# Patient Record
Sex: Female | Born: 1965 | Race: White | Hispanic: No | Marital: Married | Smoking: Never smoker
Health system: Southern US, Community
[De-identification: ages and names within clinical notes are randomized; demographics above are authoritative.]

## PROBLEM LIST (undated history)

## (undated) DIAGNOSIS — I10 Essential (primary) hypertension: Secondary | ICD-10-CM

## (undated) DIAGNOSIS — E785 Hyperlipidemia, unspecified: Secondary | ICD-10-CM

## (undated) HISTORY — DX: Hyperlipidemia, unspecified: E78.5

## (undated) HISTORY — DX: Essential (primary) hypertension: I10

## (undated) HISTORY — PX: ENDOVENOUS ABLATION SAPHENOUS VEIN W/ LASER: SUR449

---

## 1997-05-28 ENCOUNTER — Inpatient Hospital Stay (HOSPITAL_COMMUNITY): Admission: AD | Admit: 1997-05-28 | Discharge: 1997-05-30 | Payer: Self-pay | Admitting: Obstetrics & Gynecology

## 1997-06-01 ENCOUNTER — Encounter: Admission: RE | Admit: 1997-06-01 | Discharge: 1997-08-30 | Payer: Self-pay | Admitting: Obstetrics & Gynecology

## 1999-01-16 ENCOUNTER — Other Ambulatory Visit: Admission: RE | Admit: 1999-01-16 | Discharge: 1999-01-16 | Payer: Self-pay | Admitting: Gynecology

## 2000-02-24 ENCOUNTER — Other Ambulatory Visit: Admission: RE | Admit: 2000-02-24 | Discharge: 2000-02-24 | Payer: Self-pay | Admitting: Gynecology

## 2001-04-07 ENCOUNTER — Other Ambulatory Visit: Admission: RE | Admit: 2001-04-07 | Discharge: 2001-04-07 | Payer: Self-pay | Admitting: Gynecology

## 2002-07-25 ENCOUNTER — Other Ambulatory Visit: Admission: RE | Admit: 2002-07-25 | Discharge: 2002-07-25 | Payer: Self-pay | Admitting: Gynecology

## 2004-01-29 ENCOUNTER — Other Ambulatory Visit: Admission: RE | Admit: 2004-01-29 | Discharge: 2004-01-29 | Payer: Self-pay | Admitting: Gynecology

## 2005-03-23 ENCOUNTER — Other Ambulatory Visit: Admission: RE | Admit: 2005-03-23 | Discharge: 2005-03-23 | Payer: Self-pay | Admitting: Gynecology

## 2008-11-27 ENCOUNTER — Emergency Department (HOSPITAL_BASED_OUTPATIENT_CLINIC_OR_DEPARTMENT_OTHER): Admission: EM | Admit: 2008-11-27 | Discharge: 2008-11-28 | Payer: Self-pay | Admitting: Emergency Medicine

## 2010-07-27 LAB — URINALYSIS, ROUTINE W REFLEX MICROSCOPIC
Bilirubin Urine: NEGATIVE
Ketones, ur: 15 mg/dL — AB
Protein, ur: NEGATIVE mg/dL
Urobilinogen, UA: 1 mg/dL (ref 0.0–1.0)

## 2010-07-27 LAB — URINE CULTURE

## 2015-04-23 DIAGNOSIS — E785 Hyperlipidemia, unspecified: Secondary | ICD-10-CM | POA: Insufficient documentation

## 2015-04-23 DIAGNOSIS — M545 Low back pain, unspecified: Secondary | ICD-10-CM | POA: Insufficient documentation

## 2015-04-23 DIAGNOSIS — R5383 Other fatigue: Secondary | ICD-10-CM | POA: Insufficient documentation

## 2015-04-23 DIAGNOSIS — E559 Vitamin D deficiency, unspecified: Secondary | ICD-10-CM | POA: Insufficient documentation

## 2015-09-11 ENCOUNTER — Encounter: Payer: Self-pay | Admitting: *Deleted

## 2015-09-18 ENCOUNTER — Ambulatory Visit (INDEPENDENT_AMBULATORY_CARE_PROVIDER_SITE_OTHER): Payer: BLUE CROSS/BLUE SHIELD | Admitting: *Deleted

## 2015-09-18 DIAGNOSIS — I781 Nevus, non-neoplastic: Secondary | ICD-10-CM

## 2015-09-18 NOTE — Progress Notes (Signed)
X=.3% Sotradecol administered with a 27g butterfly.  Patient received a total of 6cc.  Treated all major areas of concern for this nice lady. Easy access. Tol well. Anticipate good results. Follow prn.   Compression stockings applied: Yes.

## 2015-09-24 ENCOUNTER — Encounter: Payer: Self-pay | Admitting: Vascular Surgery

## 2015-09-24 ENCOUNTER — Ambulatory Visit: Payer: BLUE CROSS/BLUE SHIELD | Admitting: *Deleted

## 2015-09-24 DIAGNOSIS — I781 Nevus, non-neoplastic: Secondary | ICD-10-CM

## 2015-09-24 NOTE — Progress Notes (Signed)
Pt had an area of concern that she wanted me to look at. I told her that it looks just as it should post tx with sclerotherapy. Pt reassurred. Follow prn.

## 2016-08-20 DIAGNOSIS — I8390 Asymptomatic varicose veins of unspecified lower extremity: Secondary | ICD-10-CM | POA: Insufficient documentation

## 2017-02-23 ENCOUNTER — Other Ambulatory Visit: Payer: Self-pay | Admitting: Obstetrics & Gynecology

## 2017-02-23 DIAGNOSIS — R928 Other abnormal and inconclusive findings on diagnostic imaging of breast: Secondary | ICD-10-CM

## 2017-02-26 ENCOUNTER — Ambulatory Visit
Admission: RE | Admit: 2017-02-26 | Discharge: 2017-02-26 | Disposition: A | Discharge status: Home / Self Care | Payer: BLUE CROSS/BLUE SHIELD | Source: Ambulatory Visit | Attending: Obstetrics & Gynecology | Admitting: Obstetrics & Gynecology

## 2017-02-26 DIAGNOSIS — R928 Other abnormal and inconclusive findings on diagnostic imaging of breast: Secondary | ICD-10-CM

## 2018-05-17 DIAGNOSIS — R638 Other symptoms and signs concerning food and fluid intake: Secondary | ICD-10-CM | POA: Insufficient documentation

## 2018-07-24 IMAGING — MG DIGITAL DIAGNOSTIC UNILATERAL RIGHT MAMMOGRAM
3 series · 3 of 3 positions shown · non-contrast
Comparison: Previous exams including recent screening mammogram
dated 02/19/2017.

CLINICAL DATA: Patient returns today to evaluate right breast
calcifications identified on recent screening mammogram.

EXAM:
DIGITAL DIAGNOSTIC RIGHT MAMMOGRAM

[R CC]
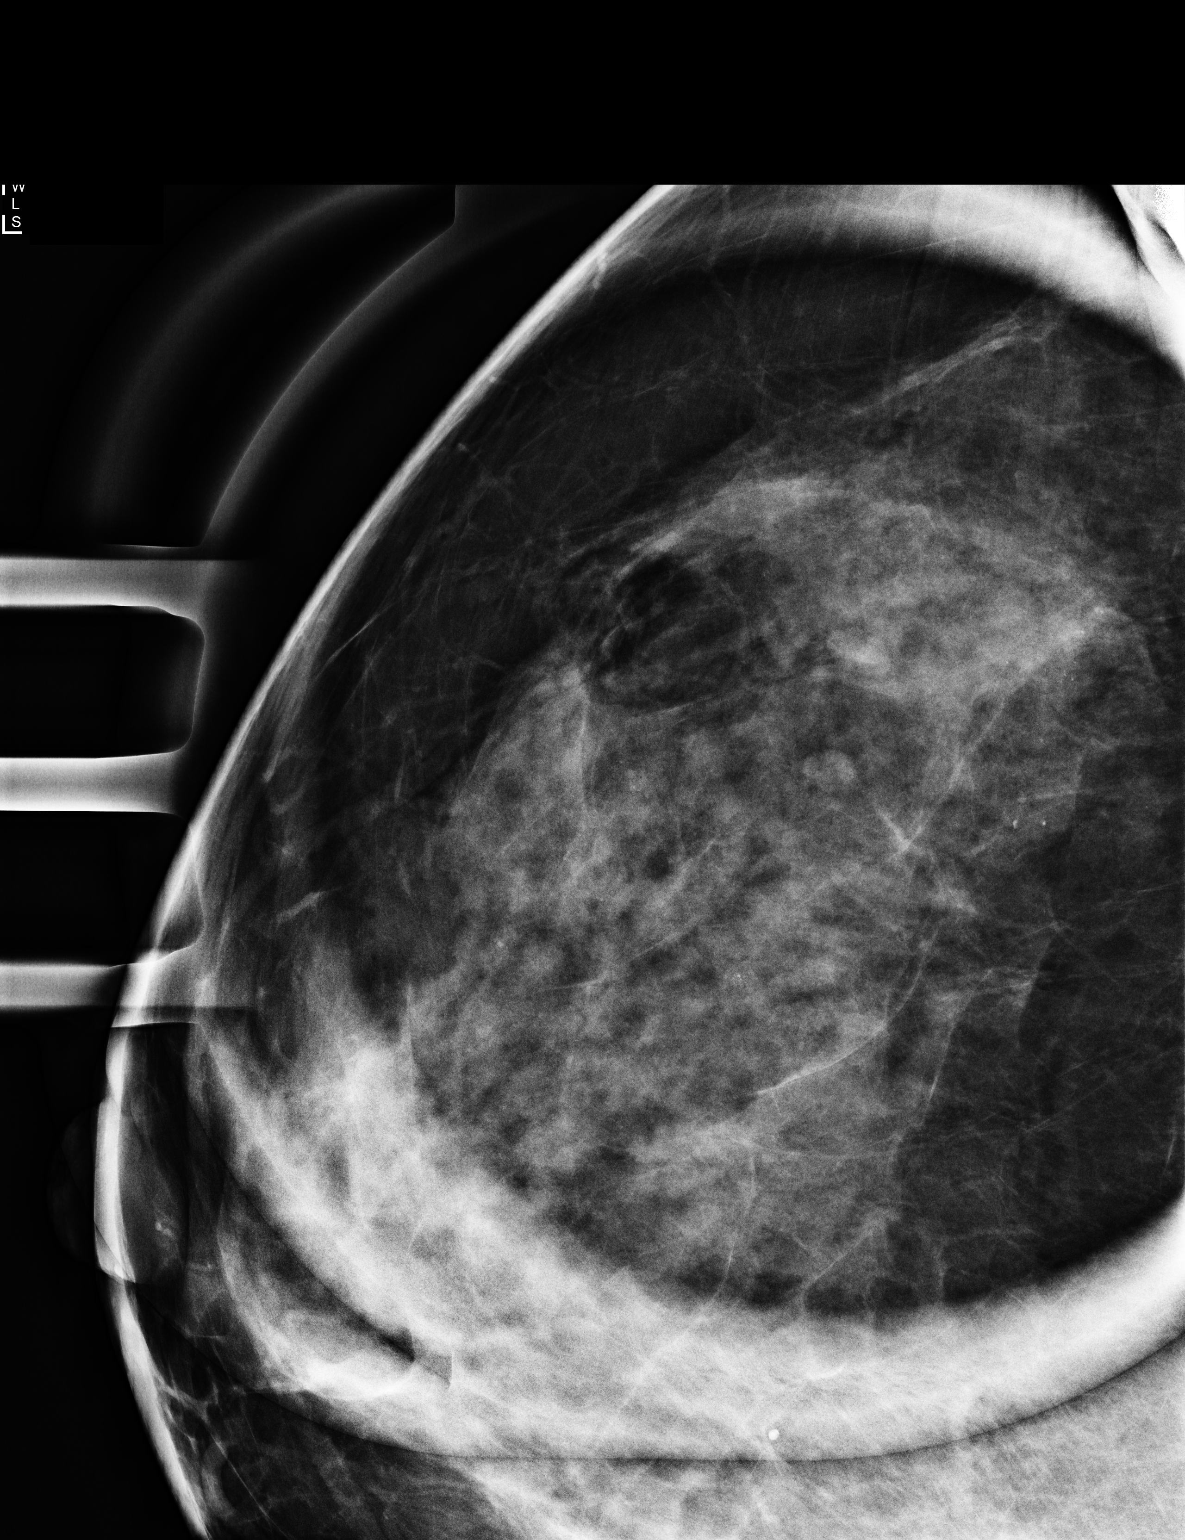

[R LM]
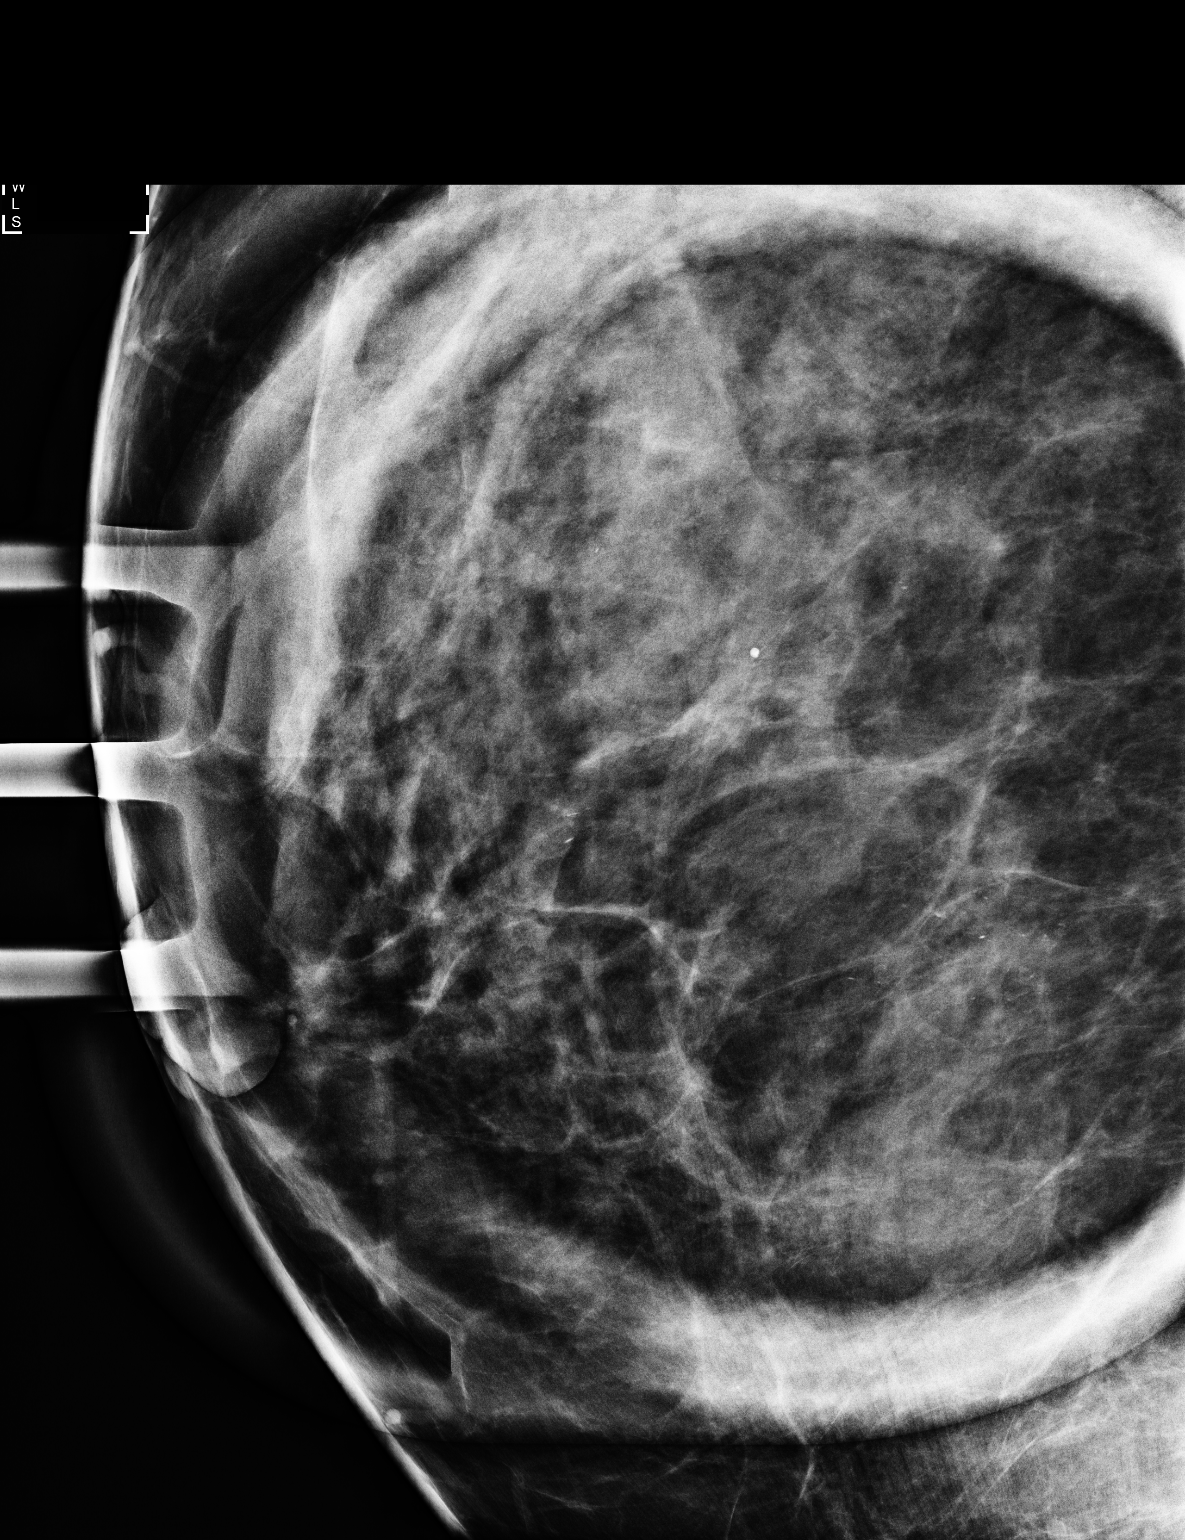

[R ML]
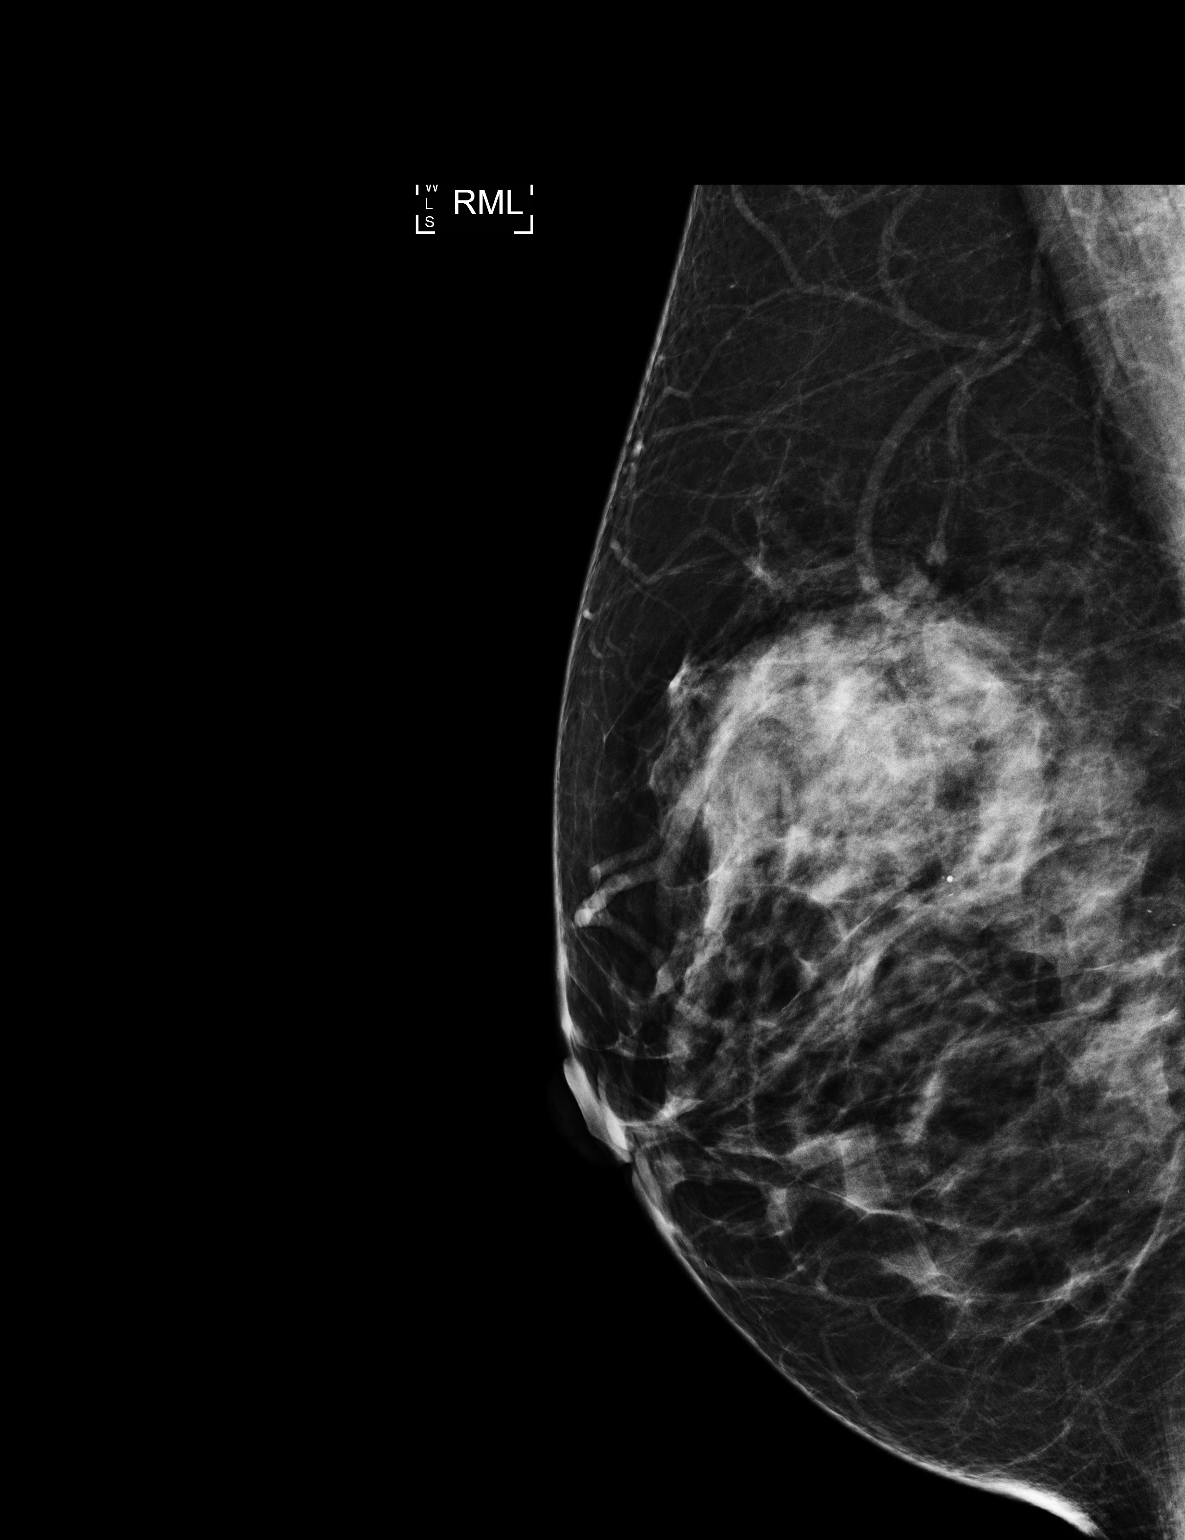

[3 of 3 positions shown; findings below may reference images not displayed]

ACR Breast Density Category c: The breast tissue is heterogeneously
dense, which may obscure small masses.
FINDINGS: On today's additional views of the right breast, including
magnification views, the calcifications within the right breast are
consistent with benign layering milk of calcium. There are no
suspicious pleomorphic or linear branching calcifications.
IMPRESSION: The right breast calcifications identified on recent screening
mammogram are consistent with benign milk of calcium. Patient may
return to routine annual bilateral screening mammogram schedule.

RECOMMENDATION:
Screening mammogram in one year.(Code:[id number])

I have discussed the findings and recommendations with the patient.
Results were also provided in writing at the conclusion of the
visit. If applicable, a reminder letter will be sent to the patient
regarding the next appointment.

BI-RADS CATEGORY  2: Benign.

## 2018-12-06 DIAGNOSIS — N951 Menopausal and female climacteric states: Secondary | ICD-10-CM | POA: Insufficient documentation

## 2018-12-22 DIAGNOSIS — R6882 Decreased libido: Secondary | ICD-10-CM | POA: Insufficient documentation

## 2019-05-08 DIAGNOSIS — Z86010 Personal history of colonic polyps: Secondary | ICD-10-CM | POA: Insufficient documentation

## 2019-07-31 ENCOUNTER — Ambulatory Visit: Payer: BLUE CROSS/BLUE SHIELD | Admitting: Physician Assistant

## 2019-07-31 ENCOUNTER — Ambulatory Visit: Payer: BC Managed Care – PPO | Admitting: Physician Assistant

## 2019-07-31 ENCOUNTER — Other Ambulatory Visit: Payer: Self-pay

## 2019-07-31 ENCOUNTER — Encounter: Payer: Self-pay | Admitting: Physician Assistant

## 2019-07-31 DIAGNOSIS — D485 Neoplasm of uncertain behavior of skin: Secondary | ICD-10-CM | POA: Diagnosis not present

## 2019-07-31 DIAGNOSIS — Z1283 Encounter for screening for malignant neoplasm of skin: Secondary | ICD-10-CM

## 2019-07-31 DIAGNOSIS — D229 Melanocytic nevi, unspecified: Secondary | ICD-10-CM

## 2019-07-31 DIAGNOSIS — D492 Neoplasm of unspecified behavior of bone, soft tissue, and skin: Secondary | ICD-10-CM

## 2019-07-31 HISTORY — DX: Melanocytic nevi, unspecified: D22.9

## 2019-07-31 NOTE — Progress Notes (Addendum)
   Follow up Visit  Subjective  Victoria Roberts is a 54 y.o. female who presents for the following: Annual Exam (All over check & less than 10 tags around neck). She has no particular lesions of concern except for a few skin tags which have become annoying around her neck. They have been there for years. She does not want them removed today.  Objective  Well appearing patient in no apparent distress; mood and affect are within normal limits.  All skin waist up examined. No suspicious moles noted on back. Pt. Did not want to remove pants as she was on the way to another appt but I noticed a dark mole on her right anterior shin. 5mmx3mm mole right shin.  Objective  Right Lower Leg - Anterior: Dark mole     Assessment & Plan  Neoplasm of skin Right Lower Leg - Anterior  Skin / nail biopsy Type of biopsy: tangential   Informed consent: discussed and consent obtained   Procedure prep:  Patient was prepped and draped in usual sterile fashion (Non sterile) Prep type:  Chlorhexidine Anesthesia: the lesion was anesthetized in a standard fashion   Anesthetic:  1% lidocaine w/ epinephrine 1-100,000 local infiltration Instrument used: flexible razor blade    Specimen 1 - Surgical pathology Differential Diagnosis: R/o Atypia Check Margins: No

## 2019-07-31 NOTE — Patient Instructions (Signed)

## 2019-08-03 ENCOUNTER — Telehealth: Payer: Self-pay | Admitting: *Deleted

## 2019-08-03 NOTE — Telephone Encounter (Signed)
Left message for patient to call us back to get her pathology results she will need a 30 minute surgery with Arlyss Gandy PA-C for severe dysplastic nevus on her right lower leg.

## 2019-08-03 NOTE — Telephone Encounter (Signed)
-----   Message from Arlyss Gandy, [place] sent at 08/03/2019 11:54 AM EDT ----- Severe atypia-ankle. Needs deeper shave with me.

## 2019-08-04 ENCOUNTER — Telehealth: Payer: Self-pay | Admitting: *Deleted

## 2019-08-04 NOTE — Telephone Encounter (Signed)
Phone call back to patient to give results. Per patient ok to leave a detailed message. Left message and  Told patient that she has a severe mole and needs another appointment with jennifer for a wider and deeper shave.

## 2019-08-04 NOTE — Telephone Encounter (Signed)
Patient returned call and says to leave a detailed message if does not answer.

## 2019-08-07 ENCOUNTER — Ambulatory Visit: Payer: BLUE CROSS/BLUE SHIELD | Admitting: Physician Assistant

## 2019-08-28 ENCOUNTER — Ambulatory Visit (INDEPENDENT_AMBULATORY_CARE_PROVIDER_SITE_OTHER): Payer: BC Managed Care – PPO | Admitting: Physician Assistant

## 2019-08-28 ENCOUNTER — Other Ambulatory Visit: Payer: Self-pay

## 2019-08-28 ENCOUNTER — Encounter: Payer: Self-pay | Admitting: Physician Assistant

## 2019-08-28 DIAGNOSIS — D2371 Other benign neoplasm of skin of right lower limb, including hip: Secondary | ICD-10-CM

## 2019-08-28 DIAGNOSIS — D239 Other benign neoplasm of skin, unspecified: Secondary | ICD-10-CM

## 2019-08-28 NOTE — Progress Notes (Signed)
   Follow up Visit  Subjective  Victoria Roberts is a 54 y.o. female who presents for the following: Procedure (here ofr tx WS- right lower leg-anterior(severe)).  Objective  Well appearing patient in no apparent distress; mood and affect are within normal limits.  A focused examination was performed including right anterior ankle. Relevant physical exam findings are noted in the Assessment and Plan.   Objective  Right lower leg-anterior: Dyspigmented scar.  [id number]  Assessment & Plan  Dysplastic nevi Right lower leg-anterior  Epidermal / dermal shaving - Right lower leg-anterior  Lesion length (cm):  0.8 Lesion width (cm):  0 Margin per side (cm):  0 Total excision diameter (cm):  0.8 Informed consent: discussed and consent obtained   Timeout: patient name, date of birth, surgical site, and procedure verified   Anesthesia: the lesion was anesthetized in a standard fashion   Anesthetic:  1% lidocaine w/ epinephrine 1-100,000 local infiltration Instrument used: flexible razor blade   Hemostasis achieved with: aluminum chloride   Outcome: patient tolerated procedure well   Post-procedure details: wound care instructions given    Specimen 1 - Surgical pathology Differential Diagnosis: r/o atypia Check Margins: No

## 2019-08-28 NOTE — Patient Instructions (Signed)

## 2019-08-30 ENCOUNTER — Telehealth: Payer: Self-pay | Admitting: *Deleted

## 2019-08-30 NOTE — Telephone Encounter (Signed)
Left message to call back for pathology results.  

## 2019-08-30 NOTE — Telephone Encounter (Signed)
-----   Message from Arlyss Gandy, [place] sent at 08/30/2019  8:47 AM EDT ----- Wider shave clear

## 2019-08-31 ENCOUNTER — Telehealth: Payer: Self-pay | Admitting: *Deleted

## 2019-08-31 NOTE — Telephone Encounter (Signed)
Path to patient. Wider shave clear. Patient has follow up in 3 months.

## 2019-11-29 ENCOUNTER — Ambulatory Visit: Payer: BC Managed Care – PPO | Admitting: Physician Assistant

## 2020-02-15 ENCOUNTER — Telehealth: Payer: Self-pay

## 2020-02-15 NOTE — Telephone Encounter (Signed)
Patient calling to see if she is still established with Dr. Loletha Grayer - looking in chart to check

## 2020-03-08 ENCOUNTER — Other Ambulatory Visit: Payer: Self-pay

## 2020-03-08 ENCOUNTER — Ambulatory Visit: Payer: BC Managed Care – PPO | Admitting: Cardiology

## 2020-03-08 ENCOUNTER — Encounter: Payer: Self-pay | Admitting: Cardiology

## 2020-03-08 VITALS — BP 149/82 | HR 77 | Ht 67.0 in | Wt 211.4 lb

## 2020-03-08 DIAGNOSIS — R030 Elevated blood-pressure reading, without diagnosis of hypertension: Secondary | ICD-10-CM | POA: Diagnosis not present

## 2020-03-08 DIAGNOSIS — R079 Chest pain, unspecified: Secondary | ICD-10-CM

## 2020-03-08 DIAGNOSIS — Z8249 Family history of ischemic heart disease and other diseases of the circulatory system: Secondary | ICD-10-CM | POA: Diagnosis not present

## 2020-03-08 DIAGNOSIS — Z7189 Other specified counseling: Secondary | ICD-10-CM

## 2020-03-08 DIAGNOSIS — I10 Essential (primary) hypertension: Secondary | ICD-10-CM

## 2020-03-08 MED ORDER — AMLODIPINE BESYLATE 2.5 MG PO TABS: 2.5000 mg | ORAL_TABLET | Freq: Every day | ORAL | 11 refills | Status: DC

## 2020-03-08 NOTE — Patient Instructions (Addendum)
Medication Instructions:  Start Amlodipine 2.5 mg daily   *If you need a refill on your cardiac medications before your next appointment, please call your pharmacy*   Lab Work: None   Testing/Procedures: Your physician has requested that you have an exercise tolerance test. For further information please visit [email or web address]. Please also follow instruction sheet, as given. [place]. [address]  You will need to have the coronavirus test completed prior to your procedure. This is a Drive Up Visit at [address], [place], [place] [address]. Please tell them that you are there for procedure testing. Stay in your car and someone will be with you shortly. Please make sure to have all other labs completed before this test because you will need to stay quarantined until your procedure.    Follow-Up: At [facility], you and your health needs are our priority.  As part of our continuing mission to provide you with exceptional heart care, we have created designated Provider Care Teams.  These Care Teams include your primary Cardiologist (physician) and Advanced Practice Providers (APPs -  Physician Assistants and Nurse Practitioners) who all work together to provide you with the care you need, when you need it.  We recommend signing up for the patient portal called "MyChart".  Sign up information is provided on this After Visit Summary.  MyChart is used to connect with patients for Virtual Visits (Telemedicine).  Patients are able to view lab/test results, encounter notes, upcoming appointments, etc.  Non-urgent messages can be sent to your provider as well.   To learn more about what you can do with MyChart, go to [email or web address].    Your next appointment:   1 year(s)  The format for your next appointment:   In Person  Provider:   Buford Dresser, MD      [facility] Cardiovascular Imaging at [facility] [address] [place], [place] [address] Phone:  [phone]        You are scheduled for an Exercise Stress Test   Please arrive 15 minutes prior to your appointment time for registration and insurance purposes.  The test will take approximately 45 minutes to complete.  How to prepare for your Exercise Stress Test: . Do bring a list of your current medications with you.  If not listed below, you may take your medications as normal. . Do wear comfortable clothes (no dresses or overalls) and walking shoes, tennis shoes preferred (no heels or open toed shoes are allowed) . Do Not wear cologne, perfume, aftershave or lotions (deodorant is allowed). . Please report to [place], [address] for your test.  If these instructions are not followed, your test will have to be rescheduled.  If you have questions or concerns about your appointment, you can call the Stress Lab at [phone].  If you cannot keep your appointment, please provide 24 hours notification to the Stress Lab, to avoid a possible $50 charge to your account

## 2020-03-08 NOTE — Progress Notes (Signed)
Cardiology Office Note:    Date:  03/08/2020   ID:  [name], DOB Nov 20, 1965, MRN [id number]  PCP:  Leia Alf, PA-C  Cardiologist:  No primary care provider on file.  Referring MD: Leia Alf, PA-C   CC: new patient consultation for chest pain  History of Present Illness:    Victoria Roberts is a 54 y.o. female without prior cardiac history who is seen as a new consult at the request of Leia Alf, PA-C for the evaluation and management of chest pain.  Per telephone note from 02/15/20 with Devona Konig, PA, patient is requesting cardiology follow up for chest pain. Has seen Dr. Sallyanne Kuster in the distant past. No other information available.  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 Not sure what is stress and what is her heart.  Also notes bilateral neck pressure.  Chest pain: -Initial onset: started recently -Quality: central pressure, like someone is sitting on her chest.  -Frequency, duration: can be constant for days at a time. Notices most when she wakes up in the morning. -Associated symptoms: feels anxious, heart racing. Also feels fullness in her neck like a pulled muscle. No shortness of breath, lightheaded, nausea, or diaphoresis. -Aggravating/alleviating factors: Does not limit her activity, [place]. No clear aggravating/alleviating factors. -Prior cardiac history: none -Prior treatment: none -Tobacco: former, quit 2007 -Comorbidities: being watched for hypertension, she reports likely going to start a medication soon with her PCP. Endorses high cholesterol. Denies kidney disease or diabetes -Exercise level: largely sedentary most days due to her job. Can walk when she goes to the mountains. -Cardiac ROS: no shortness of breath, no PND, no orthopnea, no LE edema, no syncope -Family history: father died of MI at age 24. First heart attack was at 41. Had high blood pressure, high cholesterol. Has 2 sisters, no  heart issues that she is aware of. Mother still alive, has thyroid issues, remote breast cancer. Teena Irani died of 48 of CHF. Gena Fray lived to be 86. Mat Gpa lived to 23s, Mat Gma lived to [place]  Her PCP is through [facility] reviewed in [place]. Tchol 254, LDL 175, HDl 65, TG 68 in 04/2019. 10 year risk 2.4%, lifetime risk 39%.   Past Medical History:  Diagnosis Date  . 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  Current Medications: Current Outpatient Medications on File Prior to Visit  Medication Sig  . EPINEPHrine 0.3 mg/0.3 mL IJ SOAJ injection Inject 0.3 mg into the muscle as needed.  . fluconazole (DIFLUCAN) 150 MG tablet Take 150 tablets by mouth as needed.  . S-Adenosylmethionine 400 MG TABS Take 400 mg by mouth once.  . Vitamin D, Cholecalciferol, 50 MCG (2000 UT) CAPS Take 50 mcg by mouth once.   No current facility-administered medications on file prior to visit.     Allergies:   Sulfamethoxazole   Social History   Tobacco Use  . Smoking status: Never Smoker  . Smokeless tobacco: Never Used  Substance Use Topics  . Alcohol use: Yes  . Drug use: Never    Family History: father died of MI at age 74. First heart attack was at 41. Had high blood pressure, high cholesterol. Has 2 sisters, no heart issues that she is aware of. Mother still alive, has thyroid issues, remote breast cancer. Teena Irani died of  67 of CHF. Gena Fray lived to be 37. Mat Gpa lived to 76s, Mat Gma lived to mid-80s  ROS:   Please see the history of present illness.  Additional pertinent ROS: Constitutional: Negative for chills, fever, night sweats, unintentional weight loss  HENT: Negative for ear pain and hearing loss.   Eyes: Negative for loss of vision and eye pain.  Respiratory: Negative for cough, sputum, wheezing.   Cardiovascular: See HPI. Gastrointestinal: Negative for abdominal pain, melena,  and hematochezia.  Genitourinary: Negative for dysuria and hematuria.  Musculoskeletal: Negative for falls and myalgias.  Skin: Negative for itching and rash.  Neurological: Negative for focal weakness, focal sensory changes and loss of consciousness.  Endo/Heme/Allergies: Does not bruise/bleed easily.     EKGs/Labs/Other Studies Reviewed:    The following studies were reviewed today: No prior cardiac studies  EKG:  EKG is personally reviewed.  The ekg ordered today demonstrates NSR at 77 bpm  Recent Labs: No results found for requested labs within last 8760 hours.  Recent Lipid Panel No results found for: CHOL, TRIG, HDL, CHOLHDL, VLDL, LDLCALC, LDLDIRECT  Physical Exam:    VS:  BP (!) 149/82   Pulse 77   Ht 5\' 7"  (1.702 m)   Wt 211 lb 6.4 oz (95.9 kg)   SpO2 99%   BMI 33.11 kg/m     Wt Readings from Last 3 Encounters:  03/08/20 211 lb 6.4 oz (95.9 kg)    GEN: Well nourished, well developed in no acute distress HEENT: Normal, moist mucous membranes NECK: No JVD CARDIAC: regular rhythm, normal S1 and S2, no rubs or gallops. No murmurs. VASCULAR: Radial and DP pulses 2+ bilaterally. No carotid bruits RESPIRATORY:  Clear to auscultation without rales, wheezing or rhonchi  ABDOMEN: Soft, non-tender, non-distended MUSCULOSKELETAL:  Ambulates independently SKIN: Warm and dry, no edema NEUROLOGIC:  Alert and oriented x 3. No focal neuro deficits noted. PSYCHIATRIC:  Normal affect    ASSESSMENT:    1. Chest pain of uncertain etiology   2. Elevated blood pressure reading   3. Cardiac risk counseling   4. Counseling on health promotion and disease prevention   5. Family history of heart disease   6. Essential hypertension    PLAN:    Chest pain: We spent significant time today reviewing different parts of the cardiovascular system (electrical, vascular, functional, and valvular). We discussed how each of these systems can present with different symptoms. We reviewed  that there are different ways we evaluate these symptoms with tests. We reviewed which tests I think are most appropriate given the symptoms, and we discussed risks/benefits and limitations of each of these tests. Please see summary below. We also discussed that if testing is unrevealing for a cardiac cause of the symptoms, there are many noncardiac causes as well that can contribute to symptoms. If the heart is ruled out, then I recommend returning to PCP to discuss alternative diagnoses. --discussed treadmill stress, nuclear stress/lexiscan, and CT coronary angiography. Discussed pros and cons of each, including but not limited to false positive/false negative risk, radiation risk, and risk of IV contrast dye. Based on shared decision making, decision was made to exercise treadmill stress test; will pursue CT coronary angiography if treadmill abnormal -instructed on red flag warning signs that need immediate medical attention  Elevated blood pressure reading: now consistent with hypertension -being watched closely by her PCP. They have discussed that if BP remains elevated, she will need to start a medication -given elevated reading  today, will start amlodipine 2.5 mg daily. She will contact me if BP consistently >130/80 and we can uptitrate  Cardiac risk counseling and prevention recommendations: with family history of heart disease -recommend heart healthy/Mediterranean diet, with whole grains, fruits, vegetable, fish, lean meats, nuts, and olive oil. Limit salt. -recommend moderate walking, 3-5 times/week for 30-50 minutes each session. Aim for at least 150 minutes.week. Goal should be pace of 3 miles/hours, or walking 1.5 miles in 30 minutes -recommend avoidance of tobacco products. Avoid excess alcohol.  -ASCVD risk score: The 10-year ASCVD risk score Mikey Bussing DC Brooke Bonito., et al., 2013) is: 2.2%   Values used to calculate the score:     Age: 81 years     Sex: Female     Is Non-Hispanic African  American: No     Diabetic: No     Tobacco smoker: No     Systolic Blood Pressure: 850 mmHg     Is BP treated: No     HDL Cholesterol: 65 MG/DL     Total Cholesterol: 254 MG/DL    Plan for follow up: if testing unremarkable, follow up in 1 year  Buford Dresser, MD, PhD [place]  [facility]    Medication Adjustments/Labs and Tests Ordered: Current medicines are reviewed at length with the patient today.  Concerns regarding medicines are outlined above.  Orders Placed This Encounter  Procedures  . Cardiac Stress Test: Informed Consent Details: Physician/Practitioner Attestation; Transcribe to consent form and obtain patient signature  . EXERCISE TOLERANCE TEST (ETT)  . EKG 12-Lead   Meds ordered this encounter  Medications  . DISCONTD: amLODipine (NORVASC) 2.5 MG tablet    Sig: Take 1 tablet (2.5 mg total) by mouth daily.    Dispense:  30 tablet    Refill:  11    Patient Instructions  Medication Instructions:  Start Amlodipine 2.5 mg daily   *If you need a refill on your cardiac medications before your next appointment, please call your pharmacy*   Lab Work: None   Testing/Procedures: Your physician has requested that you have an exercise tolerance test. For further information please visit [email or web address]. Please also follow instruction sheet, as given. [place]. [address]  You will need to have the coronavirus test completed prior to your procedure. This is a Drive Up Visit at [address], [place], [place] [address]. Please tell them that you are there for procedure testing. Stay in your car and someone will be with you shortly. Please make sure to have all other labs completed before this test because you will need to stay quarantined until your procedure.    Follow-Up: At [facility], you and your health needs are our priority.  As part of our continuing mission to provide you with exceptional heart care, we have created  designated Provider Care Teams.  These Care Teams include your primary Cardiologist (physician) and Advanced Practice Providers (APPs -  Physician Assistants and Nurse Practitioners) who all work together to provide you with the care you need, when you need it.  We recommend signing up for the patient portal called "MyChart".  Sign up information is provided on this After Visit Summary.  MyChart is used to connect with patients for Virtual Visits (Telemedicine).  Patients are able to view lab/test results, encounter notes, upcoming appointments, etc.  Non-urgent messages can be sent to your provider as well.   To learn more about what you can do with MyChart, go to [email or web address].  Your next appointment:   1 year(s)  The format for your next appointment:   In Person  Provider:   Buford Dresser, MD      [facility] Cardiovascular Imaging at [facility] [address] [place], [place] [address] Phone:  [phone]        You are scheduled for an Exercise Stress Test   Please arrive 15 minutes prior to your appointment time for registration and insurance purposes.  The test will take approximately 45 minutes to complete.  How to prepare for your Exercise Stress Test: . Do bring a list of your current medications with you.  If not listed below, you may take your medications as normal. . Do wear comfortable clothes (no dresses or overalls) and walking shoes, tennis shoes preferred (no heels or open toed shoes are allowed) . Do Not wear cologne, perfume, aftershave or lotions (deodorant is allowed). . Please report to [place], [address] for your test.  If these instructions are not followed, your test will have to be rescheduled.  If you have questions or concerns about your appointment, you can call the Stress Lab at [phone].  If you cannot keep your appointment, please provide 24 hours notification to the Stress Lab, to  avoid a possible $50 charge to your account    Signed, Buford Dresser, MD PhD 03/08/2020    [place]

## 2020-03-19 ENCOUNTER — Telehealth (HOSPITAL_COMMUNITY): Payer: Self-pay | Admitting: Cardiology

## 2020-03-19 NOTE — Telephone Encounter (Signed)
Patient cancelled GXT due to she will wait til after the new year and will call back to reschedule per Devonne Doughty. Order will be removed from the [place] and when pt calls back order can be reinstated.

## 2020-03-20 ENCOUNTER — Ambulatory Visit: Payer: BC Managed Care – PPO | Admitting: Cardiology

## 2020-03-22 ENCOUNTER — Other Ambulatory Visit (HOSPITAL_COMMUNITY): Payer: BC Managed Care – PPO

## 2020-03-26 ENCOUNTER — Inpatient Hospital Stay (HOSPITAL_COMMUNITY): Admission: RE | Admit: 2020-03-26 | Payer: BC Managed Care – PPO | Source: Ambulatory Visit

## 2020-04-16 ENCOUNTER — Telehealth: Payer: Self-pay | Admitting: Cardiology

## 2020-04-16 NOTE — Telephone Encounter (Signed)
Spoke to patient she stated her B/P has been elevated ranging 148/92,did not check pulse.She is taking Amlodipine 2.5 mg daily.She wanted to ask Dr.Christopher if ok to take 5 mg daily.Message sent to Dr.Christopher for advice

## 2020-04-16 NOTE — Telephone Encounter (Signed)
     Pt c/o medication issue:  1. Name of Medication:   amLODipine (NORVASC) 2.5 MG tablet    2. How are you currently taking this medication (dosage and times per day)? Take 1 tablet (2.5 mg total) by mouth daily.  3. Are you having a reaction (difficulty breathing--STAT)?   4. What is your medication issue? Pt said her BP is still elevated and this medications seems not working to control her BP. She wanted to asked Dr. Di Kindle recommendations if she needs to take it twice a day or she would like to increase the medication.

## 2020-04-21 NOTE — Telephone Encounter (Signed)
Ok to increase to 5 mg daily, monitor blood pressure, goal <130/80.

## 2020-04-22 MED ORDER — AMLODIPINE BESYLATE 5 MG PO TABS: 5.0000 mg | ORAL_TABLET | Freq: Every day | ORAL | 3 refills | Status: DC

## 2020-04-22 NOTE — Telephone Encounter (Signed)
Spoke to patient Dr.Christopher's advice given.Advised to call back if B/P continues to be elevated.

## 2020-05-08 DIAGNOSIS — L720 Epidermal cyst: Secondary | ICD-10-CM | POA: Diagnosis not present

## 2020-05-15 DIAGNOSIS — Z13228 Encounter for screening for other metabolic disorders: Secondary | ICD-10-CM | POA: Diagnosis not present

## 2020-05-15 DIAGNOSIS — E785 Hyperlipidemia, unspecified: Secondary | ICD-10-CM | POA: Diagnosis not present

## 2020-05-15 DIAGNOSIS — Z1329 Encounter for screening for other suspected endocrine disorder: Secondary | ICD-10-CM | POA: Diagnosis not present

## 2020-05-15 DIAGNOSIS — R1013 Epigastric pain: Secondary | ICD-10-CM | POA: Diagnosis not present

## 2020-05-15 DIAGNOSIS — E559 Vitamin D deficiency, unspecified: Secondary | ICD-10-CM | POA: Diagnosis not present

## 2020-05-15 DIAGNOSIS — R42 Dizziness and giddiness: Secondary | ICD-10-CM | POA: Diagnosis not present

## 2020-05-15 DIAGNOSIS — Z0000 Encounter for general adult medical examination without abnormal findings: Secondary | ICD-10-CM | POA: Diagnosis not present

## 2020-05-15 DIAGNOSIS — Z13220 Encounter for screening for lipoid disorders: Secondary | ICD-10-CM | POA: Diagnosis not present

## 2020-05-15 DIAGNOSIS — Z130 Encounter for screening for diseases of the blood and blood-forming organs and certain disorders involving the immune mechanism: Secondary | ICD-10-CM | POA: Diagnosis not present

## 2020-06-04 DIAGNOSIS — Z6832 Body mass index (BMI) 32.0-32.9, adult: Secondary | ICD-10-CM | POA: Diagnosis not present

## 2020-06-04 DIAGNOSIS — Z1231 Encounter for screening mammogram for malignant neoplasm of breast: Secondary | ICD-10-CM | POA: Diagnosis not present

## 2020-06-04 DIAGNOSIS — Z01419 Encounter for gynecological examination (general) (routine) without abnormal findings: Secondary | ICD-10-CM | POA: Diagnosis not present

## 2020-06-06 DIAGNOSIS — K219 Gastro-esophageal reflux disease without esophagitis: Secondary | ICD-10-CM | POA: Diagnosis not present

## 2020-06-06 DIAGNOSIS — E669 Obesity, unspecified: Secondary | ICD-10-CM | POA: Diagnosis not present

## 2020-06-30 ENCOUNTER — Encounter: Payer: Self-pay | Admitting: Cardiology

## 2020-07-05 ENCOUNTER — Telehealth: Payer: Self-pay | Admitting: Cardiology

## 2020-07-05 ENCOUNTER — Encounter: Payer: Self-pay | Admitting: *Deleted

## 2020-07-05 DIAGNOSIS — R079 Chest pain, unspecified: Secondary | ICD-10-CM

## 2020-07-05 MED ORDER — METOPROLOL TARTRATE 100 MG PO TABS: ORAL_TABLET | ORAL | 0 refills | Status: DC

## 2020-07-05 NOTE — Telephone Encounter (Signed)
New message:    Patient is calling to request that she have a CT of the heart. Patient asking for a order.

## 2020-07-05 NOTE — Telephone Encounter (Signed)
Spoke with Victoria Roberts, when she saw dr Harrell Gave she was given the option of gxt, nuclear or CTA. The patient has decided to have the cardiac CTA. Order placed, metoprolol sent to the pharmacy and instructs discuss in detail with letter of instructions mailed to the patient.

## 2020-07-08 ENCOUNTER — Telehealth: Payer: Self-pay | Admitting: Cardiology

## 2020-07-08 NOTE — Telephone Encounter (Signed)
Pt c/o of Chest Pain: STAT if CP now or developed within 24 hours  1. Are you having CP right now? No   2. Are you experiencing any other symptoms (ex. SOB, nausea, vomiting, sweating)? Pain behind left shoulder blade   3. How long have you been experiencing CP? Past two days off and on   4. Is your CP continuous or coming and going? Coming and going   5. Have you taken Nitroglycerin? No   Is wanting to schedule a stress test in regards to this. Please advise.  ?

## 2020-07-08 NOTE — Telephone Encounter (Signed)
Spoke with pt who state she has started experiencing on and off chest pain that starts in the middle of her chest and radiate down the back of left should blade. Pt denies any other symptoms.  Appointment scheduled for 3/28 for further evaluations.

## 2020-07-15 ENCOUNTER — Encounter: Payer: Self-pay | Admitting: Cardiology

## 2020-07-15 ENCOUNTER — Other Ambulatory Visit: Payer: Self-pay

## 2020-07-15 ENCOUNTER — Ambulatory Visit: Payer: BC Managed Care – PPO | Admitting: Cardiology

## 2020-07-15 VITALS — BP 130/74 | HR 76 | Ht 68.0 in | Wt 212.6 lb

## 2020-07-15 DIAGNOSIS — R079 Chest pain, unspecified: Secondary | ICD-10-CM | POA: Diagnosis not present

## 2020-07-15 DIAGNOSIS — E7800 Pure hypercholesterolemia, unspecified: Secondary | ICD-10-CM | POA: Diagnosis not present

## 2020-07-15 DIAGNOSIS — E7801 Familial hypercholesterolemia: Secondary | ICD-10-CM | POA: Diagnosis not present

## 2020-07-15 DIAGNOSIS — Z7189 Other specified counseling: Secondary | ICD-10-CM

## 2020-07-15 DIAGNOSIS — I10 Essential (primary) hypertension: Secondary | ICD-10-CM

## 2020-07-15 NOTE — Patient Instructions (Signed)
Medication Instructions:  Your Physician recommend you continue on your current medication as directed.    *If you need a refill on your cardiac medications before your next appointment, please call your pharmacy*   Lab Work: None   Testing/Procedures: Your physician has requested that you have an exercise tolerance test. For further information please visit [email or web address]. Please also follow instruction sheet, as given. [place]  You will need to have the coronavirus test completed prior to your procedure. This is a Drive Up Visit at [address], [place], [place] [address]. Please tell them that you are there for procedure testing. Stay in your car and someone will be with you shortly. Please make sure to have all other labs completed before this test because you will need to stay quarantined until your procedure.    Follow-Up: At [facility], you and your health needs are our priority.  As part of our continuing mission to provide you with exceptional heart care, we have created designated Provider Care Teams.  These Care Teams include your primary Cardiologist (physician) and Advanced Practice Providers (APPs -  Physician Assistants and Nurse Practitioners) who all work together to provide you with the care you need, when you need it.  We recommend signing up for the patient portal called "MyChart".  Sign up information is provided on this After Visit Summary.  MyChart is used to connect with patients for Virtual Visits (Telemedicine).  Patients are able to view lab/test results, encounter notes, upcoming appointments, etc.  Non-urgent messages can be sent to your provider as well.   To learn more about what you can do with MyChart, go to [email or web address].    Your next appointment:   6 month(s) @ [address] [place], [place] [address]   The format for your next appointment:   In Person  Provider:   Buford Dresser,  MD    [facility] at [facility] [address] [place], [place] [address] Phone:  [phone]        You are scheduled for an Exercise Stress Test  Please arrive 15 minutes prior to your appointment time for registration and insurance purposes.  The test will take approximately 45 minutes to complete.  How to prepare for your Exercise Stress Test: . Do bring a list of your current medications with you.  If not listed below, you may take your medications as normal. . Do wear comfortable clothes (no dresses or overalls) and walking shoes, tennis shoes preferred (no heels or open toed shoes are allowed) . Do Not wear cologne, perfume, aftershave or lotions (deodorant is allowed). . Please report to [place], [address] for your test.  If these instructions are not followed, your test will have to be rescheduled.  If you have questions or concerns about your appointment, you can call the Stress Lab at [phone].  If you cannot keep your appointment, please provide 24 hours notification to the Stress Lab, to avoid a possible $50 charge to your account

## 2020-07-15 NOTE — Progress Notes (Signed)
Cardiology Office Note:    Date:  07/15/2020   ID:  Victoria Roberts, DOB [birth year], MRN [id number]  PCP:  Victoria Alf, PA-C  Cardiologist:  Victoria Dresser, MD  Referring MD: Victoria Alf, PA-C   CC: follow up  History of Present Illness:    ELYSABETH Roberts is a 55 y.o. female without prior cardiac history who is seen for follow up today. I initially met her 02/2020 as a new consult at the request of Victoria Roberts, [place] for the evaluation and management of chest pain.  CV risk: -Tobacco: former, quit 06/11/05 -Family history: father died of MI at age 46. First heart attack was at 41. Had high blood pressure, high cholesterol. Has 2 sisters, no heart issues that she is aware of. Mother still alive, has thyroid issues, remote breast cancer. Victoria Roberts died of 32 of CHF. Victoria Roberts lived to be 88. Victoria Roberts lived to 33s, Victoria Roberts lived to [place]  Her PCP is through [facility] reviewed in [place]. Tchol 254, LDL 175, HDl 65, TG 68 in 04/2019. 10 year risk 2.4%, lifetime risk 39%.   Today: Continues to have chest pain. Not sure what is indigestion and what is not. No nausea. No neck/arm pain. No shortness of breath. Pending endoscopy. Chest discomfort "feels funny," can last an hour or more. Feels like butterflies in her stomach. Less like a pressure than before.   Told by her acupuncturist that she may have an issue with her heart. We discussed options for further workup.  Denies sshortness of breath at rest or with normal exertion. No PND, orthopnea, LE edema or unexpected weight gain. No syncope or palpitations.  Past Medical History:  Diagnosis Date  . 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  Current Medications: Current Outpatient Medications on File Prior to Visit  Medication Sig  . amLODipine (NORVASC) 5 MG tablet Take 1 tablet (5 mg total) by mouth daily.  . cetirizine  (ZYRTEC) 10 MG tablet Zyrtec 10 mg tablet  Take 1 tablet every day by oral route.  Marland Kitchen EPINEPHrine 0.3 mg/0.3 mL IJ SOAJ injection Inject 0.3 mg into the muscle as needed.  . fluconazole (DIFLUCAN) 150 MG tablet Take 150 tablets by mouth as needed.  Marland Kitchen IRON-VITAMIN C PO Take by mouth.  . Omega-3 Fatty Acids (FISH OIL OMEGA-3 PO) Take by mouth.  . pantoprazole (PROTONIX) 40 MG tablet 1 tab(s)  . rosuvastatin (CRESTOR) 10 MG tablet rosuvastatin 10 mg tablet  TAKE 1 TABLET BY MOUTH EVERY DAY  . S-Adenosylmethionine (SAME PO) Take 400 mg by mouth.   . Vitamin D, Cholecalciferol, 50 MCG (2000 UT) CAPS Take 50 mcg by mouth once.   No current facility-administered medications on file prior to visit.     Allergies:   Sulfamethoxazole   Social History   Tobacco Use  . Smoking status: Never Smoker  . Smokeless tobacco: Never Used  Substance Use Topics  . Alcohol use: Yes  . Drug use: Never    Family History: father died of MI at age 73. First heart attack was at 41. Had high blood pressure, high cholesterol. Has 2 sisters, no heart issues that she is aware of. Mother still alive, has thyroid issues, remote breast cancer. Victoria Roberts died of 32 of CHF. Victoria Roberts lived to be 59. Victoria Roberts lived to 90s, Victoria Roberts lived to [place]  ROS:   Please see the history of present illness.  Additional pertinent ROS otherwise unremarkable.  EKGs/Labs/Other Studies Reviewed:    The following studies were reviewed today: No prior cardiac studies  EKG:  EKG is personally reviewed.  The ekg ordered 07/15/20 demonstrates NSR at 76 bpm  Recent Labs: No results found for requested labs within last 8760 hours.  Recent Lipid Panel No results found for: CHOL, TRIG, HDL, CHOLHDL, VLDL, LDLCALC, LDLDIRECT  Physical Exam:    VS:  BP 130/74   Pulse 76   Ht _0  (1.727 m)   Wt 212 lb 9.6 oz (96.4 kg)   SpO2 97%   BMI 32.33 kg/m     Wt Readings from Last 3 Encounters:  07/15/20 212 lb 9.6 oz (96.4 kg)  03/08/20  211 lb 6.4 oz (95.9 kg)    GEN: Well nourished, well developed in no acute distress HEENT: Normal, moist mucous membranes NECK: No JVD CARDIAC: regular rhythm, normal S1 and S2, no rubs or gallops. No murmur. VASCULAR: Radial and DP pulses 2+ bilaterally. No carotid bruits RESPIRATORY:  Clear to auscultation without rales, wheezing or rhonchi  ABDOMEN: Soft, non-tender, non-distended MUSCULOSKELETAL:  Ambulates independently SKIN: Warm and dry, no edema NEUROLOGIC:  Alert and oriented x 3. No focal neuro deficits noted. PSYCHIATRIC:  Normal affect   ASSESSMENT:    1. Chest pain of uncertain etiology   2. Essential hypertension   3. Pure hypercholesterolemia   4. Heterozygous familial hypercholesterolemia   5. Cardiac risk counseling   6. Counseling on health promotion and disease prevention    PLAN:    Chest pain: -discussed at prior visit 02/2020, ordered exercise treadmill, she decided not to pursue. Also had discussed CT coronary.  -given continuation of symptoms, discussed options again today, wishes to proceed with treadmill stress. CT coronary still ordered, but she is concerned about cost. Will contact her insurance and see what her out of pocket is and then proceed with either CT coronary if affordable or treadmill if not -counseled on red flag signs that need immediate medical attention -ECG unremarkable today  Hypertension -just at goal on amlodipine daily.   Hypercholesterolemia: -outside lipids reviewed from 1.26.22. 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  -if testing suggests ischemia/CAD, would aim for LDL <70 -if cannot get to goal on statin, may need referral to lipid clinic/Dr. Hilty  Cardiac risk counseling and prevention recommendations: with family history of heart disease -recommend heart healthy/Mediterranean diet, with whole grains, fruits,  vegetable, fish, lean meats, nuts, and olive oil. Limit salt. -recommend moderate walking, 3-5 times/week for 30-50 minutes each session. Aim for at least 150 minutes.week. Goal should be pace of 3 miles/hours, or walking 1.5 miles in 30 minutes -recommend avoidance of tobacco products. Avoid excess alcohol.  -ASCVD risk score: The 10-year ASCVD risk score Mikey Bussing DC Brooke Bonito., et al., 2013) is: 2.5%   Values used to calculate the score:     Age: 39 years     Sex: Female     Is Non-Hispanic African American: No     Diabetic: No     Tobacco smoker: No     Systolic Blood Pressure: 614 mmHg     Is BP treated: Yes     HDL Cholesterol: 76 MG/DL     Total Cholesterol: 289 MG/DL    Plan for follow up: if testing unremarkable, follow up in 6  mos  Victoria Dresser, MD, PhD, [facility]    Medication Adjustments/Labs and Tests Ordered: Current medicines are reviewed at length with the patient today.  Concerns regarding medicines are outlined above.  Orders Placed This Encounter  Procedures  . EXERCISE TOLERANCE TEST (ETT)  . EKG 12-Lead   No orders of the defined types were placed in this encounter.   Patient Instructions  Medication Instructions:  Your Physician recommend you continue on your current medication as directed.    *If you need a refill on your cardiac medications before your next appointment, please call your pharmacy*   Lab Work: None   Testing/Procedures: Your physician has requested that you have an exercise tolerance test. For further information please visit [email or web address]. Please also follow instruction sheet, as given. [place]  You will need to have the coronavirus test completed prior to your procedure. This is a Drive Up Visit at [address], [place], [place] [address]. Please tell them that you are there for procedure testing. Stay in your car and someone will be with you shortly. Please make sure to have  all other labs completed before this test because you will need to stay quarantined until your procedure.    Follow-Up: At [facility], you and your health needs are our priority.  As part of our continuing mission to provide you with exceptional heart care, we have created designated Provider Care Teams.  These Care Teams include your primary Cardiologist (physician) and Advanced Practice Providers (APPs -  Physician Assistants and Nurse Practitioners) who all work together to provide you with the care you need, when you need it.  We recommend signing up for the patient portal called "MyChart".  Sign up information is provided on this After Visit Summary.  MyChart is used to connect with patients for Virtual Visits (Telemedicine).  Patients are able to view lab/test results, encounter notes, upcoming appointments, etc.  Non-urgent messages can be sent to your provider as well.   To learn more about what you can do with MyChart, go to [email or web address].    Your next appointment:   6 month(s) @ [address] [place], [place] [address]   The format for your next appointment:   In Person  Provider:   Buford Dresser, MD    [facility] Cardiovascular Imaging at [facility] [address] [place], [place] [address] Phone:  [phone]        You are scheduled for an Exercise Stress Test  Please arrive 15 minutes prior to your appointment time for registration and insurance purposes.  The test will take approximately 45 minutes to complete.  How to prepare for your Exercise Stress Test: . Do bring a list of your current medications with you.  If not listed below, you may take your medications as normal. . Do wear comfortable clothes (no dresses or overalls) and walking shoes, tennis shoes preferred (no heels or open toed shoes are allowed) . Do Not wear cologne, perfume, aftershave or lotions (deodorant is allowed). . Please report to  [place], [address] for your test.  If these instructions are not followed, your test will have to be rescheduled.  If you have questions or concerns about your appointment, you can call the Stress Lab at [phone].  If you cannot keep your appointment, please provide 24 hours notification to the Stress Lab, to avoid a possible $50 charge to your account  Signed, Victoria Dresser, MD PhD 07/15/2020    [place]

## 2020-07-23 ENCOUNTER — Telehealth (HOSPITAL_COMMUNITY): Payer: Self-pay

## 2020-07-23 ENCOUNTER — Other Ambulatory Visit (HOSPITAL_COMMUNITY)
Admission: RE | Admit: 2020-07-23 | Discharge: 2020-07-23 | Disposition: A | Discharge status: Home / Self Care | Payer: BC Managed Care – PPO | Source: Ambulatory Visit | Attending: Cardiology | Admitting: Cardiology

## 2020-07-23 DIAGNOSIS — E7800 Pure hypercholesterolemia, unspecified: Secondary | ICD-10-CM | POA: Insufficient documentation

## 2020-07-23 DIAGNOSIS — E7801 Familial hypercholesterolemia: Secondary | ICD-10-CM | POA: Insufficient documentation

## 2020-07-23 DIAGNOSIS — Z01812 Encounter for preprocedural laboratory examination: Secondary | ICD-10-CM | POA: Insufficient documentation

## 2020-07-23 DIAGNOSIS — I10 Essential (primary) hypertension: Secondary | ICD-10-CM | POA: Insufficient documentation

## 2020-07-23 DIAGNOSIS — Z20822 Contact with and (suspected) exposure to covid-19: Secondary | ICD-10-CM | POA: Diagnosis not present

## 2020-07-23 LAB — SARS CORONAVIRUS 2 (TAT 6-24 HRS): SARS Coronavirus 2: NEGATIVE

## 2020-07-23 MED ORDER — METOPROLOL TARTRATE 100 MG PO TABS: ORAL_TABLET | ORAL | 0 refills | Status: DC

## 2020-07-23 NOTE — Telephone Encounter (Signed)
Close encounter 

## 2020-07-25 ENCOUNTER — Ambulatory Visit (HOSPITAL_COMMUNITY)
Admission: RE | Admit: 2020-07-25 | Discharge: 2020-07-25 | Disposition: A | Discharge status: Home / Self Care | Payer: BC Managed Care – PPO | Source: Ambulatory Visit | Attending: Internal Medicine | Admitting: Internal Medicine

## 2020-07-25 ENCOUNTER — Other Ambulatory Visit: Payer: Self-pay

## 2020-07-25 DIAGNOSIS — Z8249 Family history of ischemic heart disease and other diseases of the circulatory system: Secondary | ICD-10-CM | POA: Insufficient documentation

## 2020-07-25 DIAGNOSIS — R079 Chest pain, unspecified: Secondary | ICD-10-CM | POA: Diagnosis not present

## 2020-07-25 DIAGNOSIS — Z87891 Personal history of nicotine dependence: Secondary | ICD-10-CM | POA: Diagnosis not present

## 2020-07-25 DIAGNOSIS — K219 Gastro-esophageal reflux disease without esophagitis: Secondary | ICD-10-CM | POA: Insufficient documentation

## 2020-07-25 LAB — EXERCISE TOLERANCE TEST
Estimated workload: 10.8 METS
Exercise duration (min): 9 min
Exercise duration (sec): 30 s
MPHR: 167 {beats}/min
Peak HR: 146 {beats}/min
Percent HR: 87 %
Rest HR: 80 {beats}/min

## 2020-08-13 ENCOUNTER — Ambulatory Visit (HOSPITAL_COMMUNITY): Payer: BC Managed Care – PPO

## 2020-08-19 ENCOUNTER — Telehealth: Payer: Self-pay | Admitting: Genetic Counselor

## 2020-08-19 NOTE — Telephone Encounter (Signed)
Discussed approximate cost of genetic counseling appt before insurance, cost of family variant testing vs panel testing.  Confirmed genetics appt for Friday.

## 2020-08-21 DIAGNOSIS — K209 Esophagitis, unspecified without bleeding: Secondary | ICD-10-CM | POA: Diagnosis not present

## 2020-08-21 DIAGNOSIS — K2970 Gastritis, unspecified, without bleeding: Secondary | ICD-10-CM | POA: Diagnosis not present

## 2020-08-21 DIAGNOSIS — K319 Disease of stomach and duodenum, unspecified: Secondary | ICD-10-CM | POA: Diagnosis not present

## 2020-08-21 DIAGNOSIS — K219 Gastro-esophageal reflux disease without esophagitis: Secondary | ICD-10-CM | POA: Diagnosis not present

## 2020-08-21 DIAGNOSIS — K31A11 Gastric intestinal metaplasia without dysplasia, involving the antrum: Secondary | ICD-10-CM | POA: Diagnosis not present

## 2020-08-23 ENCOUNTER — Other Ambulatory Visit: Payer: Self-pay

## 2020-08-23 ENCOUNTER — Inpatient Hospital Stay: Payer: BC Managed Care – PPO | Attending: Genetic Counselor | Admitting: Genetic Counselor

## 2020-08-23 ENCOUNTER — Inpatient Hospital Stay: Payer: BC Managed Care – PPO

## 2020-08-23 DIAGNOSIS — Z800 Family history of malignant neoplasm of digestive organs: Secondary | ICD-10-CM

## 2020-08-23 DIAGNOSIS — Z8049 Family history of malignant neoplasm of other genital organs: Secondary | ICD-10-CM

## 2020-08-23 DIAGNOSIS — K219 Gastro-esophageal reflux disease without esophagitis: Secondary | ICD-10-CM | POA: Insufficient documentation

## 2020-08-23 DIAGNOSIS — Z8042 Family history of malignant neoplasm of prostate: Secondary | ICD-10-CM

## 2020-08-23 DIAGNOSIS — Z8041 Family history of malignant neoplasm of ovary: Secondary | ICD-10-CM

## 2020-08-23 DIAGNOSIS — Z8489 Family history of other specified conditions: Secondary | ICD-10-CM

## 2020-08-23 DIAGNOSIS — Z803 Family history of malignant neoplasm of breast: Secondary | ICD-10-CM

## 2020-08-28 ENCOUNTER — Encounter: Payer: Self-pay | Admitting: Genetic Counselor

## 2020-08-28 DIAGNOSIS — Z8041 Family history of malignant neoplasm of ovary: Secondary | ICD-10-CM

## 2020-08-28 DIAGNOSIS — Z8042 Family history of malignant neoplasm of prostate: Secondary | ICD-10-CM

## 2020-08-28 DIAGNOSIS — Z803 Family history of malignant neoplasm of breast: Secondary | ICD-10-CM

## 2020-08-28 DIAGNOSIS — Z8489 Family history of other specified conditions: Secondary | ICD-10-CM

## 2020-08-28 DIAGNOSIS — Z800 Family history of malignant neoplasm of digestive organs: Secondary | ICD-10-CM

## 2020-08-28 DIAGNOSIS — Z8049 Family history of malignant neoplasm of other genital organs: Secondary | ICD-10-CM

## 2020-08-28 HISTORY — DX: Family history of malignant neoplasm of digestive organs: Z80.0

## 2020-08-28 HISTORY — DX: Family history of malignant neoplasm of ovary: Z80.41

## 2020-08-28 HISTORY — DX: Family history of malignant neoplasm of other genital organs: Z80.49

## 2020-08-28 HISTORY — DX: Family history of other specified conditions: Z84.89

## 2020-08-28 HISTORY — DX: Family history of malignant neoplasm of breast: Z80.3

## 2020-08-28 HISTORY — DX: Family history of malignant neoplasm of prostate: Z80.42

## 2020-08-28 NOTE — Progress Notes (Signed)
REFERRING PROVIDER: No referring provider defined for this encounter.  PRIMARY PROVIDER:  Leia Alf, PA-C  PRIMARY REASON FOR VISIT:  1. Family history of breast cancer   2. Family history of BAP1 hereditary cancer mutation   3. Family history of ovarian cancer   4. Family history of uterine cancer   5. Family history of colon cancer   6. Family history of prostate cancer      HISTORY OF PRESENT ILLNESS:   Ms. Victoria Roberts, a 55 y.o. female, was seen for a  cancer genetics consultation due to a family history of cancer and a family history in a mutation in the BAP1 gene.  [name] presents to clinic today with her sisters to discuss the possibility of a hereditary predisposition to cancer, to discuss genetic testing, and to further clarify her future cancer risks, as well as potential cancer risks for family members.   [name] is a 55 y.o. female with no personal history of cancer.    CANCER HISTORY:  Oncology History   No history exists.    RISK FACTORS:  Menarche was at age 68.  First live birth at age 40.  Ovaries intact: yes.  Hysterectomy: no.  HRT use: 0 years. Colonoscopy: yes; three polyps. Mammogram within the last year: yes. Number of breast biopsies: 0.   Past Medical History:  Diagnosis Date  . 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 Family history of BAP1 hereditary cancer mutation 08/28/2020  . Family history of breast cancer 08/28/2020  . Family history of colon cancer 08/28/2020  . Family history of ovarian cancer 08/28/2020  . Family history of prostate cancer 08/28/2020  . Family history of uterine cancer 08/28/2020    No past surgical history on file.  Social History   Socioeconomic History  . Marital status: Married    Spouse name: Not on file  . Number of children: Not on file  . Years of education: Not on file  . Highest education level: Not on file  Occupational History  . Not on file  Tobacco  Use  . Smoking status: Never Smoker  . Smokeless tobacco: Never Used  Substance and Sexual Activity  . Alcohol use: Yes  . Drug use: Never  . Sexual activity: Not on file  Other Topics Concern  . Not on file  Social History Narrative  . Not on file   Social Determinants of Health   Financial Resource Strain: Not on file  Food Insecurity: Not on file  Transportation Needs: Not on file  Physical Activity: Not on file  Stress: Not on file  Social Connections: Not on file     FAMILY HISTORY:  We obtained a detailed, 4-generation family history.  Significant diagnoses are listed below: Family History  Problem Relation Age of Onset  . Breast cancer Mother        contralateral; dx 69; dx 23  . Other Mother        BAP1 mutation   . Cervical cancer Sister 68  . Breast cancer Maternal Aunt        contralateral; dx 53; dx 68  . Colon cancer Maternal Grandmother 28  . Cancer Maternal Grandfather 90       unknown type  . Ovarian cancer Other 62       MGF's sisters, x2  . Pancreatic cancer Other 60       MGF's sister  . Colon cancer Other 80  MGM's brother  . Uterine cancer Other 70       MGM's sister  . Prostate cancer Father        dx before 4  . Breast cancer Paternal Aunt 32    [name] has two sisters, one of whom had cervical cancer in her 44s.  She has one son and one daughter in their 24s.    [name] mother had contralateral breast cancer diagnosed at age 90 and 60. [name] mother had genetic testing of a 90 gene panel through [employer] in 2022, and was positive for a mutation in BAP1 called [id number] ([id number]).  [name] maternal aunt had contralateral breast cancer diagnosed at ages 18 and 54. [name] maternal aunt had panel genetic testing with no pathogenic variants detected.  [name] maternal grandmother had colon cancer at age 82.  [name] maternal grandfather had an unknown type of cancer diagnosed his 30s.  There  is a family history of ovarian and pancreatic cancer in her maternal grandfather's sisters.  There is a family history of colon and uterine cancer in her maternal grandmother's siblings.   [name] father died at age 53 and had prostate cancer before the age of 44.  [name] paternal aunt had breast cancer at age 64.  Her paternal grandfather had kidney cancer diagnosed at age 30.   [name] is unaware of previous family history of genetic testing for hereditary cancer risks besides that mentioned above. There is no reported Ashkenazi Jewish ancestry. There is no known consanguinity.  GENETIC COUNSELING ASSESSMENT: Ms. Claus is a 55 y.o. female with a paternal family history of cancer which is somewhat suggestive of a hereditary cancer syndrome and a maternal family of a known hereditary predisposition to cancer. We, therefore, discussed and recommended the following at today's visit.   DISCUSSION: We discussed that 5 - 10% of cancer is hereditary, with most cases of hereditary breast and prostate cancer associated with mutations in BRCA1/2.  There are other genes that can be associated with hereditary breast and prostate cancer syndromes.  Type of cancer risk and level of risk are gene-specific.  We discussed that given her mother has a mutation in the BAP1 gene, Ms. Kamath has a 50% chance of having the same mutation in BAP1.  We reviewed the cancer risks associated with BAP1 mutations including renal cell carcinoma, melanoma, other skin cancers, and mesothelioma.  We also discussed management strategies and familial implications associated with a positive result in BAP1.   We discussed that testing is beneficial for several reasons, including knowing about other cancer risks, identifying potential screening and risk-reduction options that may be appropriate, and to understanding if other family members could be at risk for cancer and allowing them to undergo genetic testing.  We reviewed the  characteristics, features and inheritance patterns of hereditary cancer syndromes. We also discussed genetic testing, including the appropriate family members to test, the process of testing, insurance coverage and turn-around-time for results. We discussed the implications of a negative, positive, carrier and/or variant of uncertain significant result. We discussed that negative results would be uninformative given that Ms. Diez does not have a personal history of cancer. We recommended Ms. Beckerman pursue genetic testing for a panel that contains genes associated with breast and prostate cancer as well as the BAP1 gene.  Based on Ms. Wray's paternal family history of breast and prostate cancer and family history of a BAP1 mutation, she meets medical criteria  for genetic testing. Despite that she meets criteria, she may still have an out of pocket cost.   We discussed that some people do not want to undergo genetic testing due to fear of genetic discrimination.  A federal law called the Genetic Information Non-Discrimination Act (GINA) of 2008 helps protect individuals against genetic discrimination based on their genetic test results.  It impacts both health insurance and employment.  With health insurance, it protects against increased premiums, being kicked off insurance or being forced to take a test in order to be insured.  For employment it protects against hiring, firing and promoting decisions based on genetic test results.  GINA does not apply to those in the [employer], those who work for companies with less than 15 employees, and new life insurance or long-term disability insurance policies.  Health status due to a cancer diagnosis is not protected under GINA.  Based on the patient's family history, a statistical model [employer] cabin [employer]) was used to estimate her risk of developing breast cancer. This estimates her lifetime risk of developing breast cancer to be approximately 21%. This estimation does  not consider any personal genetic testing results.  The patient's lifetime breast cancer risk is a preliminary estimate based on available information using one of several models endorsed by the [place] (ACS). The ACS recommends consideration of breast MRI screening as an adjunct to mammography for patients at high risk (defined as 20% or greater lifetime risk).      Ms. Crandell has been determined to be at high risk for breast cancer.  Therefore, we recommend that annual screening with mammography and breast MRI be performed. We discussed that Ms. Quashie should discuss her individual situation with her primary care physician and determine a breast cancer screening plan with which they are both comfortable.  She declined a referral to our high risk breast cancer clinic at this time.    PLAN:  Ms. Dreier did not wish to pursue genetic testing at today's visit due to GINA considerations. We understand this decision and remain available to coordinate genetic testing at any time in the future. We, therefore, recommend Ms. Leoni continue to follow the cancer screening guidelines given by her primary healthcare provider and speak with her primary care provider about annual breast MRIs given her Tyrer Cuzick score.   Lastly, we encouraged Ms. Breighner to remain in contact with cancer genetics annually so that we can continuously update the family history and inform her of any changes in cancer genetics and testing that may be of benefit for this family.   Ms. Feimster questions were answered to her satisfaction today. Our contact information was provided should additional questions or concerns arise. Thank you for the referral and allowing [place] to share in the care of your patient.   Wyat Infinger M. Joette Catching, [place], [facility] Genetic Counselor [email or web address] ([phone]) [phone]   The patient was seen for a total of 40 minutes in face-to-face genetic counseling.  Drs. Magrinat, Lindi Adie and/or Burr Medico  were available to discuss this case as needed.  _______________________________________________________________________ For Office Staff:  Number of people involved in session: 3 Was an Intern/ student involved with case: no

## 2020-09-10 DIAGNOSIS — E785 Hyperlipidemia, unspecified: Secondary | ICD-10-CM | POA: Diagnosis not present

## 2020-09-10 DIAGNOSIS — Z8481 Family history of carrier of genetic disease: Secondary | ICD-10-CM | POA: Insufficient documentation

## 2020-09-10 DIAGNOSIS — E559 Vitamin D deficiency, unspecified: Secondary | ICD-10-CM | POA: Diagnosis not present

## 2020-09-10 DIAGNOSIS — R5383 Other fatigue: Secondary | ICD-10-CM | POA: Diagnosis not present

## 2020-09-12 DIAGNOSIS — M79661 Pain in right lower leg: Secondary | ICD-10-CM | POA: Diagnosis not present

## 2020-09-12 DIAGNOSIS — K22719 Barrett's esophagus with dysplasia, unspecified: Secondary | ICD-10-CM | POA: Diagnosis not present

## 2020-09-12 DIAGNOSIS — I10 Essential (primary) hypertension: Secondary | ICD-10-CM | POA: Diagnosis not present

## 2020-09-12 DIAGNOSIS — M7989 Other specified soft tissue disorders: Secondary | ICD-10-CM | POA: Diagnosis not present

## 2020-09-18 DIAGNOSIS — M79662 Pain in left lower leg: Secondary | ICD-10-CM | POA: Diagnosis not present

## 2020-09-18 DIAGNOSIS — M7989 Other specified soft tissue disorders: Secondary | ICD-10-CM | POA: Diagnosis not present

## 2020-09-18 DIAGNOSIS — M79661 Pain in right lower leg: Secondary | ICD-10-CM | POA: Diagnosis not present

## 2020-10-08 DIAGNOSIS — L918 Other hypertrophic disorders of the skin: Secondary | ICD-10-CM | POA: Diagnosis not present

## 2020-10-08 DIAGNOSIS — L814 Other melanin hyperpigmentation: Secondary | ICD-10-CM | POA: Diagnosis not present

## 2020-10-08 DIAGNOSIS — L821 Other seborrheic keratosis: Secondary | ICD-10-CM | POA: Diagnosis not present

## 2020-10-08 DIAGNOSIS — D229 Melanocytic nevi, unspecified: Secondary | ICD-10-CM | POA: Diagnosis not present

## 2020-11-13 DIAGNOSIS — B370 Candidal stomatitis: Secondary | ICD-10-CM | POA: Diagnosis not present

## 2020-11-13 DIAGNOSIS — M545 Low back pain, unspecified: Secondary | ICD-10-CM | POA: Diagnosis not present

## 2020-11-13 DIAGNOSIS — R1031 Right lower quadrant pain: Secondary | ICD-10-CM | POA: Diagnosis not present

## 2020-11-14 DIAGNOSIS — M1611 Unilateral primary osteoarthritis, right hip: Secondary | ICD-10-CM | POA: Diagnosis not present

## 2020-11-14 DIAGNOSIS — Z6831 Body mass index (BMI) 31.0-31.9, adult: Secondary | ICD-10-CM | POA: Diagnosis not present

## 2020-11-14 DIAGNOSIS — R1900 Intra-abdominal and pelvic swelling, mass and lump, unspecified site: Secondary | ICD-10-CM | POA: Diagnosis not present

## 2020-11-28 ENCOUNTER — Telehealth: Payer: Self-pay | Admitting: Cardiology

## 2020-11-28 NOTE — Telephone Encounter (Signed)
Patient is requesting to switch providers from Dr. Harrell Gave to Dr. Sallyanne Kuster. Please advise.  Thank you

## 2020-11-28 NOTE — Telephone Encounter (Signed)
OK by me 

## 2021-01-06 NOTE — Progress Notes (Incomplete)
Cardiology Office Note:    Date:  01/06/2021   ID:  Victoria Roberts, DOB Apr 25, 1965, MRN [id number]  PCP:  Leia Alf, PA-C  Cardiologist:  Buford Dresser, MD  Referring MD: Leia Alf, PA-C   CC: follow up  History of Present Illness:    Victoria Roberts is a 55 y.o. female without prior cardiac history who is seen for follow up today. I initially met her 02/2020 as a new consult at the request of Leia Alf, [place] for the evaluation and management of chest pain.  CV risk: -Tobacco: former, quit 06-17-05 -Family history: father died of MI at age 86. First heart attack was at 41. Had high blood pressure, high cholesterol. Has 2 sisters, no heart issues that she is aware of. Mother still alive, has thyroid issues, remote breast cancer. Teena Irani died of 69 of CHF. Gena Fray lived to be 28. Mat Gpa lived to 70s, Mat Gma lived to [place]  Her PCP is through [facility] reviewed in [place]. Tchol 254, LDL 175, HDl 65, TG 68 in 04/2019. 10 year risk 2.4%, lifetime risk 39%.   Today: Continues to have chest pain. Not sure what is indigestion and what is not. No nausea. No neck/arm pain. No shortness of breath. Pending endoscopy. Chest discomfort "feels funny," can last an hour or more. Feels like butterflies in her stomach. Less like a pressure than before.   Told by her acupuncturist that she may have an issue with her heart. We discussed options for further workup.  Denies sshortness of breath at rest or with normal exertion. No PND, orthopnea, LE edema or unexpected weight gain. No syncope or palpitations.  Today:   She denies any palpitations, chest pain, or shortness of breath. No lightheadedness, headaches, syncope, orthopnea, or PND. Also has no lower extremity edema or exertional symptoms.  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  Current Medications: Current Outpatient Medications on File Prior to Visit  Medication Sig   amLODipine (NORVASC) 5 MG tablet Take 1 tablet (5 mg total) by mouth daily.   cetirizine (ZYRTEC) 10 MG tablet Zyrtec 10 mg tablet  Take 1 tablet every day by oral route.   EPINEPHrine 0.3 mg/0.3 mL IJ SOAJ injection Inject 0.3 mg into the muscle as needed.   fluconazole (DIFLUCAN) 150 MG tablet Take 150 tablets by mouth as needed.   IRON-VITAMIN C PO Take by mouth.   metoprolol tartrate (LOPRESSOR) 100 MG tablet TAKE 2 HOURS PRIOR TO CT SCAN   Omega-3 Fatty Acids (FISH OIL OMEGA-3 PO) Take by mouth.   pantoprazole (PROTONIX) 40 MG tablet 1 tab(s)   rosuvastatin (CRESTOR) 10 MG tablet rosuvastatin 10 mg tablet  TAKE 1 TABLET BY MOUTH EVERY DAY   S-Adenosylmethionine (SAME PO) Take 400 mg by mouth.    Vitamin D, Cholecalciferol, 50 MCG (2000 UT) CAPS Take 50 mcg by mouth once.   No current facility-administered medications on file prior to visit.     Allergies:   Sulfamethoxazole   Social History   Tobacco  Use   Smoking status: Never   Smokeless tobacco: Never  Substance Use Topics   Alcohol use: Yes   Drug use: Never    Family History: father died of MI at age 6. First heart attack was at 41. Had high blood pressure, high cholesterol. Has 2 sisters, no heart issues that she is aware of. Mother still alive, has thyroid issues, remote breast cancer. Teena Irani died of 68 of CHF. Gena Fray lived to be 41. Mat Gpa lived to 63s, Mat Gma lived to mid-80s  ROS:   Please see the history of present illness.   (+) Additional pertinent ROS otherwise unremarkable.  EKGs/Labs/Other Studies Reviewed:     The following studies were reviewed today:  [place] LE Venous Duplex 09/18/2020 (CE): Impression: No evidence of deep vein thrombosis in either lower extremity.  ETT 07/25/2020: Good exercise capacity, achieved 10.8 METS Hypertensive response to exercise There was no ST segment deviation noted during stress. No evidence of ischemia on stress EKG  EKG:  EKG is personally reviewed.   01/09/2021: *** 07/15/20: NSR at 76 bpm  Recent Labs: No results found for requested labs within last 8760 hours.  Recent Lipid Panel No results found for: CHOL, TRIG, HDL, CHOLHDL, VLDL, LDLCALC, LDLDIRECT  Physical Exam:    VS:  There were no vitals taken for this visit.    Wt Readings from Last 3 Encounters:  07/15/20 212 lb 9.6 oz (96.4 kg)  03/08/20 211 lb 6.4 oz (95.9 kg)    GEN: Well nourished, well developed in no acute distress HEENT: Normal, moist mucous membranes NECK: No JVD CARDIAC: regular rhythm, normal S1 and S2, no rubs or gallops. No murmur. VASCULAR: Radial and DP pulses 2+ bilaterally. No carotid bruits RESPIRATORY:  Clear to auscultation without rales, wheezing or rhonchi  ABDOMEN: Soft, non-tender, non-distended MUSCULOSKELETAL:  Ambulates independently SKIN: Warm and dry, no edema NEUROLOGIC:  Alert and oriented x 3. No focal neuro deficits noted. PSYCHIATRIC:  Normal affect   ASSESSMENT:    No diagnosis found.  PLAN:    Chest pain: -discussed at prior visit 02/2020, ordered exercise treadmill, she decided not to pursue. Also had discussed CT coronary.  -given continuation of symptoms, discussed options again today, wishes to proceed with treadmill stress. CT coronary still ordered, but she is concerned about cost. Will contact her insurance and see what her out of pocket is and then proceed with either CT coronary if affordable or treadmill if not -counseled on red flag signs that need immediate medical attention -ECG unremarkable today  Hypertension -just at goal on  amlodipine daily.   Hypercholesterolemia: -outside lipids reviewed from 1.26.22. 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  -if testing suggests ischemia/CAD, would aim for LDL <70 -if cannot get to goal on statin, may need referral to lipid clinic/Dr. Hilty  Cardiac risk counseling and prevention recommendations: with family history of heart disease -recommend heart healthy/Mediterranean diet, with whole grains, fruits, vegetable, fish, lean meats, nuts, and olive oil. Limit salt. -recommend moderate walking, 3-5 times/week for 30-50 minutes each session. Aim for at least 150 minutes.week. Goal should be pace of 3 miles/hours, or walking 1.5 miles in 30 minutes -recommend avoidance of tobacco products. Avoid excess alcohol.  -ASCVD risk score: The 10-year ASCVD risk score (Arnett DK, et al., 2019) is: 1.4%   Values used to calculate the score:     Age: 70 years  Sex: Female     Is Non-Hispanic African American: No     Diabetic: No     Tobacco smoker: No     Systolic Blood Pressure: 597 mmHg     Is BP treated: Yes     HDL Cholesterol: 65 MG/DL     Total Cholesterol: 167 MG/DL    Plan for follow up: if testing unremarkable, follow up in 6 mos***  Buford Dresser, MD, PhD, [facility]    Medication Adjustments/Labs and Tests Ordered: Current medicines are reviewed at length with the patient today.  Concerns regarding medicines are outlined above.   No orders of the defined types were placed in this encounter.  No orders of the defined types were placed in this encounter.  There are no Patient Instructions on file for this visit.   I,Mathew Stumpf,acting as a Education administrator for [employer], MD.,have documented all relevant documentation on the behalf of Buford Dresser, MD,as directed by  Buford Dresser, MD while in the presence  of Buford Dresser, MD.  ***  Signed, Buford Dresser, MD PhD 01/06/2021    [place]

## 2021-01-09 ENCOUNTER — Ambulatory Visit (HOSPITAL_BASED_OUTPATIENT_CLINIC_OR_DEPARTMENT_OTHER): Payer: BC Managed Care – PPO | Admitting: Cardiology

## 2021-03-19 ENCOUNTER — Ambulatory Visit: Payer: BC Managed Care – PPO | Admitting: Cardiovascular Disease

## 2021-04-09 DIAGNOSIS — L720 Epidermal cyst: Secondary | ICD-10-CM | POA: Diagnosis not present

## 2021-04-10 ENCOUNTER — Other Ambulatory Visit: Payer: Self-pay | Admitting: Cardiology

## 2021-05-22 ENCOUNTER — Other Ambulatory Visit: Payer: Self-pay

## 2021-05-22 ENCOUNTER — Encounter: Payer: Self-pay | Admitting: Podiatry

## 2021-05-22 ENCOUNTER — Ambulatory Visit (INDEPENDENT_AMBULATORY_CARE_PROVIDER_SITE_OTHER): Payer: BC Managed Care – PPO | Admitting: Podiatry

## 2021-05-22 ENCOUNTER — Ambulatory Visit (INDEPENDENT_AMBULATORY_CARE_PROVIDER_SITE_OTHER): Payer: BC Managed Care – PPO

## 2021-05-22 ENCOUNTER — Other Ambulatory Visit: Payer: Self-pay | Admitting: Podiatry

## 2021-05-22 ENCOUNTER — Telehealth: Payer: Self-pay | Admitting: Podiatry

## 2021-05-22 DIAGNOSIS — M79671 Pain in right foot: Secondary | ICD-10-CM

## 2021-05-22 DIAGNOSIS — M79672 Pain in left foot: Secondary | ICD-10-CM | POA: Diagnosis not present

## 2021-05-22 DIAGNOSIS — M722 Plantar fascial fibromatosis: Secondary | ICD-10-CM | POA: Diagnosis not present

## 2021-05-22 DIAGNOSIS — M7732 Calcaneal spur, left foot: Secondary | ICD-10-CM | POA: Diagnosis not present

## 2021-05-22 DIAGNOSIS — M19072 Primary osteoarthritis, left ankle and foot: Secondary | ICD-10-CM | POA: Diagnosis not present

## 2021-05-22 MED ORDER — TRIAMCINOLONE ACETONIDE 0.1 % EX CREA: 1.0000 "application " | TOPICAL_CREAM | Freq: Two times a day (BID) | CUTANEOUS | 0 refills | Status: DC

## 2021-05-22 MED ORDER — DEXAMETHASONE SODIUM PHOSPHATE 120 MG/30ML IJ SOLN: 4.0000 mg | Freq: Once | INTRAMUSCULAR | Status: AC

## 2021-05-22 NOTE — Patient Instructions (Signed)

## 2021-05-22 NOTE — Progress Notes (Signed)
°  Subjective:  Patient ID: Victoria Roberts, female    DOB: 08/27/1965,   MRN: [id number]  Chief Complaint  Patient presents with   Foot Pain    BIL foot pain- pain is mostly at the heel    56 y.o. female presents for bilateral heel pain that has been going on for several months. She relates it is not constant and only when she is inactive and gets up will it hurt . Sometimes she does struggle with walking. She rests and that helps. Denies OTC medications. Has been icing and tried new inserts.  . Denies any other pedal complaints. Denies n/v/f/c.   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 CFT <3 seconds. Normal hair growth on digits. No edema.  Skin. No lacerations or abrasions bilateral feet.  Musculoskeletal: MMT 5/5 bilateral lower extremities in DF, PF, Inversion and Eversion. Deceased ROM in DF of ankle joint. Tender to medial calcaneal tubercle bilateral L>R. No pain with medial calcaneal squeeze. No pain to PT or achilles tendon. No pain along arch of foot Neurological: Sensation intact to light touch.   Assessment:   1. Plantar fasciitis of left foot   2. Plantar fasciitis of right foot      Plan:  Patient was evaluated and treated and all questions answered. Discussed plantar fasciitis with patient.  X-rays reviewed and discussed with patient. No acute fractures or dislocations noted. Mild spurring noted at inferior calcaneus.  Discussed treatment options including, ice, NSAIDS, supportive shoes, bracing, and stretching. Stretching exercises provided to be done  on a daily basis.   Would like to avoid NSAIDs  PF braces dispensed today.  Patient requesting injection today. Procedure note below.   Follow-up 6 weeks or sooner if any problems arise. In the meantime, encouraged to call the office with any questions, concerns, change in symptoms.   Procedure:  Discussed etiology, pathology, conservative vs. surgical therapies. At this time a plantar fascial injection was recommended.  The patient agreed and a sterile skin prep was applied.  An injection consisting of  dexamethasone and marcaine mixture was infiltrated at the point of maximal tenderness on the left Heel.  Bandaid applied. The patient tolerated this well and was given instructions for aftercare.    Lorenda Peck, DPM

## 2021-05-22 NOTE — Telephone Encounter (Signed)
[facility] [place]    Patient husband called , his wife stated that you were calling in a cream for her use on her foot, [facility] ?

## 2021-05-26 ENCOUNTER — Ambulatory Visit: Payer: BC Managed Care – PPO | Admitting: Physician Assistant

## 2021-05-26 ENCOUNTER — Other Ambulatory Visit: Payer: Self-pay

## 2021-05-26 ENCOUNTER — Encounter: Payer: Self-pay | Admitting: Physician Assistant

## 2021-05-26 VITALS — BP 120/80 | HR 83 | Temp 98.6°F | Resp 20 | Ht 68.0 in | Wt 202.1 lb

## 2021-05-26 DIAGNOSIS — I8393 Asymptomatic varicose veins of bilateral lower extremities: Secondary | ICD-10-CM | POA: Diagnosis not present

## 2021-05-26 NOTE — Progress Notes (Signed)
[facility]   Reason for referral: History of venous reflux  History of Present Illness  Victoria Roberts is a 56 y.o. female who presents with chief complaint: swollen leg.  Patient notes, onset of swelling 2009 s/p B LE laser ablation treatment.  She denise edema, heaviness and aches in B LE.  She does have plantar fasciitis and nighttime calf pain.   She states she was in [place] for a month in January and did not like the way her legs looked.  She is interested in sclera therapy for her spider veins.   The patient has had no history of DVT, positive history of varicose vein, no history of venous stasis ulcers, no history of  Lymphedema and no history of skin changes in lower legs.   The patient has  used compression stockings in the past.  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  Social History   Socioeconomic History   Marital status: Married    Spouse name: Not on file   Number of children: Not on file   Years of education: Not on file   Highest education level: Not on file  Occupational History   Not on file  Tobacco Use   Smoking status: Never   Smokeless tobacco: Never  Substance and Sexual Activity   Alcohol use: Yes   Drug use: Never   Sexual activity: Not on file  Other Topics Concern   Not on file  Social History Narrative   Not on file   Social Determinants of Health   Financial Resource Strain: Not on file  Food Insecurity: Not on file  Transportation Needs: Not on file  Physical Activity: Not on file  Stress: Not on file  Social Connections:  Not on file  Intimate Partner Violence: Not on file    Family History  Problem Relation Age of Onset   Breast cancer Mother        contralateral; dx 1; dx 21   Other Mother        BAP1 mutation    Cervical cancer Sister 68   Breast cancer Maternal Aunt        contralateral; dx 13; dx 68   Colon cancer Maternal Grandmother 6   Cancer Maternal Grandfather 90       unknown type   Ovarian cancer Other 18       MGF's sisters, x2   Pancreatic cancer Other 42       MGF's sister   Colon cancer Other 45       MGM's brother   Uterine cancer Other 27       MGM's sister   Prostate cancer Father        dx before 55   Breast cancer Paternal Aunt 72    Current Outpatient Medications on File Prior to Visit  Medication Sig Dispense Refill   amLODipine (NORVASC) 5 MG tablet TAKE 1 TABLET (5 MG TOTAL) BY MOUTH DAILY. 30 tablet 0   cetirizine (ZYRTEC) 10 MG tablet Zyrtec 10 mg tablet  Take 1 tablet every day  by oral route.     EPINEPHrine 0.3 mg/0.3 mL IJ SOAJ injection Inject 0.3 mg into the muscle as needed.     fluconazole (DIFLUCAN) 150 MG tablet Take 150 tablets by mouth as needed.     IRON-VITAMIN C PO Take by mouth.     Omega-3 Fatty Acids (FISH OIL OMEGA-3 PO) Take by mouth.     pantoprazole (PROTONIX) 40 MG tablet 1 tab(s)     rosuvastatin (CRESTOR) 10 MG tablet rosuvastatin 10 mg tablet  TAKE 1 TABLET BY MOUTH EVERY DAY     S-Adenosylmethionine (SAME PO) Take 400 mg by mouth.      triamcinolone cream (KENALOG) 0.1 % Apply 1 application topically 2 (two) times daily. 30 g 0   Vitamin D, Cholecalciferol, 50 MCG (2000 UT) CAPS Take 50 mcg by mouth once.     Current Facility-Administered Medications on File Prior to Visit  Medication Dose Route Frequency Provider Last Rate Last Admin   dexamethasone (DECADRON) injection 4 mg  4 mg Intra-articular Once Lorenda Peck, MD        Allergies as of 05/26/2021 - Review Complete 05/22/2021  Allergen Reaction Noted   Sulfamethoxazole  Other (See Comments) and Rash 04/23/2015     ROS:   General:  No weight loss, Fever, chills  HEENT: No recent headaches, no nasal bleeding, no visual changes, no sore throat  Neurologic: No dizziness, blackouts, seizures. No recent symptoms of stroke or mini- stroke. No recent episodes of slurred speech, or temporary blindness.  Cardiac: No recent episodes of chest pain/pressure, no shortness of breath at rest.  No shortness of breath with exertion.  Denies history of atrial fibrillation or irregular heartbeat  Vascular: No history of rest pain in feet.  No history of claudication.  No history of non-healing ulcer, No history of DVT   Pulmonary: No home oxygen, no productive cough, no hemoptysis,  No asthma or wheezing  Musculoskeletal:  [ ]  Arthritis, [ ]  Low back pain,  [ ]  Joint pain  Hematologic:No history of hypercoagulable state.  No history of easy bleeding.  No history of anemia  Gastrointestinal: No hematochezia or melena,  No gastroesophageal reflux, no trouble swallowing  Urinary: [ ]  chronic Kidney disease, [ ]  on HD - [ ]  MWF or [ ]  TTHS, [ ]  Burning with urination, [ ]  Frequent urination, [ ]  Difficulty urinating;   Skin: No rashes  Psychological: No history of anxiety,  No history of depression  Physical Examination  Vitals:   05/26/21 1047  BP: 120/80  Pulse: 83  Resp: 20  Temp: 98.6 F (37 C)  TempSrc: Temporal  SpO2: 96%  Weight: 202 lb 1.6 oz (91.7 kg)  Height: 5\' 8"  (1.727 m)    Body mass index is 30.73 kg/m.  General:  Alert and oriented, no acute distress HEENT: Normal Neck: No bruit or JVD Pulmonary: Clear to auscultation bilaterally Cardiac: Regular Rate and Rhythm without murmur Abdomen: Soft, non-tender, non-distended, no mass, no scars Skin: No rash, multiple areas of small patches spider veins, no varicose veins. 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 Multiple areas of spider veins No edema, venous wounds or skin changes reported or note on exam. She mentioned calf pain at bed time and no pain with ambulation.  She states she  wears thigh high compression at night and this helps her calf pain.  She has palpable pedal pulses B and is not at risk of limb loss.    Plan:The patient was not sure if she still has venous reflux and is unsure of past treatment plans.  I have schedule her for venous reflux studies and a return visit.  At that time pictures of her LE's will need to be taken and if her GSV's are closed she will be scheduled to f/u with Izora Gala for sclero therapy.   Roxy Horseman PA-C [facility] Office: [phone]  MD in clinic [place]

## 2021-05-29 ENCOUNTER — Other Ambulatory Visit: Payer: Self-pay | Admitting: *Deleted

## 2021-05-29 DIAGNOSIS — I8393 Asymptomatic varicose veins of bilateral lower extremities: Secondary | ICD-10-CM

## 2021-06-03 ENCOUNTER — Other Ambulatory Visit: Payer: Self-pay | Admitting: Cardiology

## 2021-06-03 NOTE — Telephone Encounter (Signed)
°*  STAT* If patient is at the pharmacy, call can be transferred to refill team.   1. Which medications need to be refilled? (please list name of each medication and dose if known) amLODipine (NORVASC) 5 MG tablet  2. Which pharmacy/location (including street and city if local pharmacy) is medication to be sent to? [id number] [place], [place] - [address]  3. Do they need a 30 day or 90 day supply? 90 day

## 2021-06-03 NOTE — Telephone Encounter (Signed)
Rx(s) sent to pharmacy electronically.  

## 2021-06-12 ENCOUNTER — Encounter (HOSPITAL_COMMUNITY): Payer: BC Managed Care – PPO

## 2021-06-12 ENCOUNTER — Ambulatory Visit: Payer: BC Managed Care – PPO

## 2021-06-13 ENCOUNTER — Other Ambulatory Visit: Payer: Self-pay

## 2021-06-13 ENCOUNTER — Ambulatory Visit (INDEPENDENT_AMBULATORY_CARE_PROVIDER_SITE_OTHER): Payer: Self-pay

## 2021-06-13 DIAGNOSIS — I8393 Asymptomatic varicose veins of bilateral lower extremities: Secondary | ICD-10-CM

## 2021-06-13 NOTE — Progress Notes (Signed)
Treated pt's small spider veins with Asclera 1% administered with a 27g butterfly.  Patient received a total of 2 mL. Pt tolerated well. She only wanted to treat center areas, as she has concerns about anything touching any areas near her bones. I avoided all of these areas. She was given post treatment care instructions on handout and verbally. Will follow prn.   Photos: Yes.    Compression stockings applied: Yes.

## 2021-06-23 DIAGNOSIS — Z6831 Body mass index (BMI) 31.0-31.9, adult: Secondary | ICD-10-CM | POA: Diagnosis not present

## 2021-06-23 DIAGNOSIS — Z01419 Encounter for gynecological examination (general) (routine) without abnormal findings: Secondary | ICD-10-CM | POA: Diagnosis not present

## 2021-06-23 DIAGNOSIS — Z1231 Encounter for screening mammogram for malignant neoplasm of breast: Secondary | ICD-10-CM | POA: Diagnosis not present

## 2021-07-03 ENCOUNTER — Encounter: Payer: Self-pay | Admitting: Podiatry

## 2021-07-03 ENCOUNTER — Other Ambulatory Visit: Payer: Self-pay

## 2021-07-03 ENCOUNTER — Ambulatory Visit (INDEPENDENT_AMBULATORY_CARE_PROVIDER_SITE_OTHER): Payer: BC Managed Care – PPO | Admitting: Podiatry

## 2021-07-03 DIAGNOSIS — M722 Plantar fascial fibromatosis: Secondary | ICD-10-CM | POA: Diagnosis not present

## 2021-07-03 NOTE — Progress Notes (Signed)
?  Subjective:  ?Patient ID: [name], female    DOB: 1965-11-05,   MRN: [id number] ? ?Chief Complaint  ?Patient presents with  ? Foot Pain  ?  Bilateral foot pain , patient states foot pain is better than the last visit , but she is concerned about her bone spurs and states right foot is worst than left foot    ? ? ?56 y.o. female presents for follow-up of bilateral plantar fasciitis. Relates that it is doing a little bit better. States she does still have some pain in the right and relates her calfs are still tight with stretching. Also concern for "string" underneath her right great toenail.  . Denies any other pedal complaints. Denies n/v/f/c.  ? ?Past Medical History:  ?Diagnosis Date  ? 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 severe-right lower leg-anterior (WS)  ? Family history of BAP1 hereditary cancer mutation 08/28/2020  ? Family history of breast cancer 08/28/2020  ? Family history of colon cancer 08/28/2020  ? Family history of ovarian cancer 08/28/2020  ? Family history of prostate cancer 08/28/2020  ? Family history of uterine cancer 08/28/2020  ? ? ?Objective:  ?Physical Exam: ?Vascular: DP/PT pulses 2/4 bilateral. CFT <3 seconds. Normal hair growth on digits. No edema.  ?Skin. No lacerations or abrasions bilateral feet.  ?Musculoskeletal: MMT 5/5 bilateral lower extremities in DF, PF, Inversion and Eversion. Deceased ROM in DF of ankle joint. Tender to medial calcaneal tubercle bilateral L>R. No pain with medial calcaneal squeeze. No pain to PT or achilles tendon. No pain along arch of foot ?Neurological: Sensation intact to light touch.  ? ?Assessment:  ? ?1. Plantar fasciitis of left foot   ?2. Plantar fasciitis of right foot   ? ? ? ? ?Plan:  ?Patient was evaluated and treated and all questions answered. ?Discussed plantar fasciitis with patient.  ?X-rays reviewed and discussed with patient. No acute fractures or dislocations noted. Mild spurring noted at inferior calcaneus.  ?Discussed treatment options  including, ice, NSAIDS, supportive shoes, bracing, and stretching.  ?Continue stretching exercises.  ?Would like to avoid NSAIDs  ?Referral to PT placed.  ?Will try night splint.  ?Defers injection today.  ?Follow-up 8 weeks or sooner if any problems arise. In the meantime, encouraged to call the office with any questions, concerns, change in symptoms.  ? ? ?Lorenda Peck, DPM  ? ? ?

## 2021-07-04 DIAGNOSIS — M25571 Pain in right ankle and joints of right foot: Secondary | ICD-10-CM | POA: Diagnosis not present

## 2021-07-04 DIAGNOSIS — R2689 Other abnormalities of gait and mobility: Secondary | ICD-10-CM | POA: Diagnosis not present

## 2021-07-08 DIAGNOSIS — R2689 Other abnormalities of gait and mobility: Secondary | ICD-10-CM | POA: Diagnosis not present

## 2021-07-08 DIAGNOSIS — M25571 Pain in right ankle and joints of right foot: Secondary | ICD-10-CM | POA: Diagnosis not present

## 2021-07-16 DIAGNOSIS — M25571 Pain in right ankle and joints of right foot: Secondary | ICD-10-CM | POA: Diagnosis not present

## 2021-07-16 DIAGNOSIS — R2689 Other abnormalities of gait and mobility: Secondary | ICD-10-CM | POA: Diagnosis not present

## 2021-07-17 ENCOUNTER — Other Ambulatory Visit: Payer: Self-pay | Admitting: Podiatry

## 2021-07-18 DIAGNOSIS — R2689 Other abnormalities of gait and mobility: Secondary | ICD-10-CM | POA: Diagnosis not present

## 2021-07-18 DIAGNOSIS — M25571 Pain in right ankle and joints of right foot: Secondary | ICD-10-CM | POA: Diagnosis not present

## 2021-07-24 ENCOUNTER — Telehealth: Payer: Self-pay | Admitting: Cardiovascular Disease

## 2021-07-24 MED ORDER — AMLODIPINE BESYLATE 5 MG PO TABS: 5.0000 mg | ORAL_TABLET | Freq: Every day | ORAL | 0 refills | Status: DC

## 2021-07-24 NOTE — Telephone Encounter (Signed)
?*  STAT* If patient is at the pharmacy, call can be transferred to refill team. ? ? ?1. Which medications need to be refilled? (please list name of each medication and dose if known) amLODipine (NORVASC) 5 MG tablet ? ?2. Which pharmacy/location (including street and city if local pharmacy) is medication to be sent to? [id number] [place], [place] - [address] ? ?3. Do they need a 30 day or 90 day supply? 90 day supply ? ? ?Patient is scheduled to see Dr. CSallyanne Kusterthis month.  ?

## 2021-08-05 ENCOUNTER — Ambulatory Visit: Payer: BC Managed Care – PPO

## 2021-08-05 DIAGNOSIS — R2689 Other abnormalities of gait and mobility: Secondary | ICD-10-CM | POA: Diagnosis not present

## 2021-08-05 DIAGNOSIS — M25571 Pain in right ankle and joints of right foot: Secondary | ICD-10-CM | POA: Diagnosis not present

## 2021-08-15 ENCOUNTER — Ambulatory Visit: Payer: BC Managed Care – PPO | Admitting: Cardiovascular Disease

## 2021-08-19 DIAGNOSIS — M25571 Pain in right ankle and joints of right foot: Secondary | ICD-10-CM | POA: Diagnosis not present

## 2021-08-19 DIAGNOSIS — R2689 Other abnormalities of gait and mobility: Secondary | ICD-10-CM | POA: Diagnosis not present

## 2021-08-28 ENCOUNTER — Ambulatory Visit: Payer: BC Managed Care – PPO | Admitting: Podiatry

## 2021-08-29 ENCOUNTER — Ambulatory Visit: Payer: BC Managed Care – PPO

## 2021-09-01 DIAGNOSIS — Z1321 Encounter for screening for nutritional disorder: Secondary | ICD-10-CM | POA: Diagnosis not present

## 2021-09-01 DIAGNOSIS — K22719 Barrett's esophagus with dysplasia, unspecified: Secondary | ICD-10-CM | POA: Diagnosis not present

## 2021-09-01 DIAGNOSIS — E559 Vitamin D deficiency, unspecified: Secondary | ICD-10-CM | POA: Diagnosis not present

## 2021-09-01 DIAGNOSIS — Z0000 Encounter for general adult medical examination without abnormal findings: Secondary | ICD-10-CM | POA: Diagnosis not present

## 2021-09-01 DIAGNOSIS — Z1329 Encounter for screening for other suspected endocrine disorder: Secondary | ICD-10-CM | POA: Diagnosis not present

## 2021-09-01 DIAGNOSIS — Z13220 Encounter for screening for lipoid disorders: Secondary | ICD-10-CM | POA: Diagnosis not present

## 2021-09-03 DIAGNOSIS — Z0000 Encounter for general adult medical examination without abnormal findings: Secondary | ICD-10-CM | POA: Diagnosis not present

## 2021-09-03 DIAGNOSIS — I8393 Asymptomatic varicose veins of bilateral lower extremities: Secondary | ICD-10-CM | POA: Diagnosis not present

## 2021-09-03 DIAGNOSIS — R1013 Epigastric pain: Secondary | ICD-10-CM | POA: Diagnosis not present

## 2021-09-03 DIAGNOSIS — I10 Essential (primary) hypertension: Secondary | ICD-10-CM | POA: Diagnosis not present

## 2021-09-03 DIAGNOSIS — E782 Mixed hyperlipidemia: Secondary | ICD-10-CM | POA: Diagnosis not present

## 2021-11-13 ENCOUNTER — Encounter: Payer: Self-pay | Admitting: Cardiovascular Disease

## 2021-11-13 ENCOUNTER — Ambulatory Visit: Payer: BC Managed Care – PPO | Admitting: Cardiovascular Disease

## 2021-11-13 VITALS — BP 122/74 | HR 87 | Ht 67.0 in | Wt 203.6 lb

## 2021-11-13 DIAGNOSIS — E7801 Familial hypercholesterolemia: Secondary | ICD-10-CM

## 2021-11-13 DIAGNOSIS — T466X5A Adverse effect of antihyperlipidemic and antiarteriosclerotic drugs, initial encounter: Secondary | ICD-10-CM | POA: Diagnosis not present

## 2021-11-13 DIAGNOSIS — I10 Essential (primary) hypertension: Secondary | ICD-10-CM | POA: Diagnosis not present

## 2021-11-13 DIAGNOSIS — G720 Drug-induced myopathy: Secondary | ICD-10-CM

## 2021-11-13 NOTE — Progress Notes (Signed)
Cardiology Office Note:    Date:  11/14/2021   ID:  [name], DOB 1965-08-08, MRN [id number]  PCP:  Valente, Louis R, [place] [facility] Cardiologist:  Buford Dresser, MD     Referring MD: Leia Alf, PA-C   No chief complaint on file. Victoria Roberts is a 56 y.o. female who is being seen today for the evaluation of hyperlipidemia at the request of Leia Alf, [place].   History of Present Illness:    Victoria Roberts is a 56 y.o. female with a hx of hypercholesterolemia, hypertension and GERD.  Her husband Victoria Roberts is also my patient.  For many years I took care of her grandfather "Victoria" Roberts, who was a World War II veteran.  She was previously seen by my partner, Dr. Harrell Gave.  Victoria Roberts has a strong family history of early onset CAD.  Her father had his first myocardial infarction at age 85 and died at age 52.  Her paternal grandmother died of heart failure at age 31.  She has hypercholesterolemia with an untreated LDL cholesterol 175, normal HDL and triglyceride levels.  On 09/01/2021, her most recent lipid profile on therapy showed a direct LDL cholesterol of 103 she has stopped taking rosuvastatin due to side effects.  She is a remote smoker and quit in 2007.  She does not have diabetes mellitus.  She has mild hypertension.  When she weighed less her blood pressure was normal.  She thinks she has gained a lot of weight because she has had limited ability to exercise due to plantar fasciitis and other orthopedic problems.  She believes treatment with rosuvastatin triggered many of her foot complaints and leg cramps.  The patient specifically denies any chest pain at rest exertion, dyspnea at rest or with exertion, orthopnea, paroxysmal nocturnal dyspnea, syncope, palpitations, focal neurological deficits, intermittent claudication, lower extremity edema, unexplained weight gain, cough, hemoptysis or wheezing.  She had a normal treadmill stress test in  April 2022.  She has not had any imaging studies for coronary disease.  ECG today is completely normal.  She has GERD and takes alternating monthly regimens of pantoprazole and famotidine to control her symptoms.  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  Current Medications: Current Meds  Medication Sig   amLODipine (NORVASC) 5 MG tablet Take 1 tablet (5 mg total) by mouth daily.   famotidine (PEPCID) 40 MG tablet Take 40 mg by mouth daily.   mometasone (NASONEX) 50 MCG/ACT nasal spray Place 1 spray into the nose as needed.   pantoprazole (PROTONIX) 40 MG tablet 1 tab(s)   Vitamin D, Cholecalciferol, 50 MCG (2000 UT) CAPS Take 50 mcg by mouth once.   Current Facility-Administered Medications for the 11/13/21 encounter (Office Visit) with Amelie Caracci, Dani Gobble, MD  Medication   dexamethasone (DECADRON) injection 4 mg     Allergies:   Sulfamethoxazole   Social History   Socioeconomic History   Marital status: Married    Spouse name: Not on file   Number of children: Not on file   Years of education:  Not on file   Highest education level: Not on file  Occupational History   Not on file  Tobacco Use   Smoking status: Never   Smokeless tobacco: Never  Substance and Sexual Activity   Alcohol use: Yes   Drug use: Never   Sexual activity: Not on file  Other Topics Concern   Not on file  Social History Narrative   Not on file   Social Determinants of Health   Financial Resource Strain: Not on file  Food Insecurity: Not on file  Transportation Needs: Not on file  Physical Activity: Not on file   Stress: Not on file  Social Connections: Not on file     Family History: The patient's family history includes Breast cancer in her maternal aunt and mother; Breast cancer (age of onset: 68) in her paternal aunt; Cancer (age of onset: 36) in her maternal grandfather; Cervical cancer (age of onset: 65) in her sister; Colon cancer (age of onset: 13) in her maternal grandmother; Colon cancer (age of onset: 59) in an other family member; Other in her mother; Ovarian cancer (age of onset: 23) in an other family member; Pancreatic cancer (age of onset: 62) in an other family member; Prostate cancer in her father; Uterine cancer (age of onset: 58) in an other family member.  ROS:   Please see the history of present illness.     All other systems reviewed and are negative.  EKGs/Labs/Other Studies Reviewed:    The following studies were reviewed today: Treadmill stress test May 2022 (normal).  Labs from PCP 09/01/2021  EKG:  EKG is ordered today.  The ekg ordered today demonstrates normal sinus rhythm, normal tracing  Recent Labs: No results found for requested labs within last 365 days.  09/01/2021  sodium 138, potassium 4.6, glucose 101, creatinine 0.65 borderline alkaline phosphatase 111, other liver function test normal, hemoglobin 13.3 Recent Lipid Panel No results found for: "CHOL", "TRIG", "HDL", "CHOLHDL", "VLDL", "LDLCALC", "LDLDIRECT" 09/01/2021 cholesterol 194, triglycerides 81, HDL 78 direct LDL 100.  Risk Assessment/Calculations:           Physical Exam:    VS:  BP 122/74 (BP Location: Left Arm, Patient Position: Sitting, Cuff Size: Large)   Pulse 87   Ht '5\' 7"'$  (1.702 m)   Wt 203 lb 9.6 oz (92.4 kg)   SpO2 94%   BMI 31.89 kg/m     Wt Readings from Last 3 Encounters:  11/13/21 203 lb 9.6 oz (92.4 kg)  05/26/21 202 lb 1.6 oz (91.7 kg)  07/15/20 212 lb 9.6 oz (96.4 kg)     GEN: Mildly obese, Well nourished, well developed in no acute distress HEENT: Normal NECK:  No JVD; No carotid bruits LYMPHATICS: No lymphadenopathy CARDIAC: RRR, no murmurs, rubs, gallops RESPIRATORY:  Clear to auscultation without rales, wheezing or rhonchi  ABDOMEN: Soft, non-tender, non-distended MUSCULOSKELETAL:  No edema; No deformity  SKIN: Warm and dry NEUROLOGIC:  Alert and oriented x 3 PSYCHIATRIC:  Normal affect   ASSESSMENT:    1. Heterozygous familial hypercholesterolemia   2. Essential hypertension   3. Statin myopathy    PLAN:    In order of problems listed above:  [place]: I think it is highly likely that she has this diagnosis.  She has very early onset CAD in her father.  I think she would benefit from lipid-lowering therapy.  She is confident that the statin did cause for musculoskeletal side effects.  We could try to use an alternative  agent such as atorvastatin, but an even better option would be to use Repatha or Praluent.  I recommend a coronary calcium score to reinforce the opinion that she needs lipid-lowering therapy.  She wants to think about it first and do it after she returns from her planned trip to [place].  Regardless of implementation of pharmacological therapy or not, strongly recommend increased physical activity to a minimum of 3 hours a week and in particular paying attention to her diet low in saturated fat, sweets and high glycemic index carbohydrates, enriched with lean protein and unsaturated fat. HTN: Well-controlled with current medications.  I agree that with substantial weight loss she might be able to completely stop antihypertensive medications.           Medication Adjustments/Labs and Tests Ordered: Current medicines are reviewed at length with the patient today.  Concerns regarding medicines are outlined above.  Orders Placed This Encounter  Procedures   EKG 12-Lead   No orders of the defined types were placed in this encounter.   Patient Instructions  Medication Instructions:  No changes *If you need a refill on your  cardiac medications before your next appointment, please call your pharmacy*   Lab Work: None ordered If you have labs (blood work) drawn today and your tests are completely normal, you will receive your results only by: [place] (if you have MyChart) OR A paper copy in the mail If you have any lab test that is abnormal or we need to change your treatment, we will call you to review the results.   Testing/Procedures: None ordered   Follow-Up: At [facility], you and your health needs are our priority.  As part of our continuing mission to provide you with exceptional heart care, we have created designated Provider Care Teams.  These Care Teams include your primary Cardiologist (physician) and Advanced Practice Providers (APPs -  Physician Assistants and Nurse Practitioners) who all work together to provide you with the care you need, when you need it.  We recommend signing up for the patient portal called "MyChart".  Sign up information is provided on this After Visit Summary.  MyChart is used to connect with patients for Virtual Visits (Telemedicine).  Patients are able to view lab/test results, encounter notes, upcoming appointments, etc.  Non-urgent messages can be sent to your provider as well.   To learn more about what you can do with MyChart, go to [email or web address].    Your next appointment:   12 month(s)  The format for your next appointment:   In Person  Provider:   Dr. Sallyanne Kuster  Important Information About Sugar         Signed, Sanda Klein, MD  11/14/2021 8:58 PM    [place]

## 2021-11-13 NOTE — Patient Instructions (Signed)
Medication Instructions:  No changes *If you need a refill on your cardiac medications before your next appointment, please call your pharmacy*   Lab Work: None ordered If you have labs (blood work) drawn today and your tests are completely normal, you will receive your results only by: [place] (if you have MyChart) OR A paper copy in the mail If you have any lab test that is abnormal or we need to change your treatment, we will call you to review the results.   Testing/Procedures: None ordered   Follow-Up: At [facility], you and your health needs are our priority.  As part of our continuing mission to provide you with exceptional heart care, we have created designated Provider Care Teams.  These Care Teams include your primary Cardiologist (physician) and Advanced Practice Providers (APPs -  Physician Assistants and Nurse Practitioners) who all work together to provide you with the care you need, when you need it.  We recommend signing up for the patient portal called "MyChart".  Sign up information is provided on this After Visit Summary.  MyChart is used to connect with patients for Virtual Visits (Telemedicine).  Patients are able to view lab/test results, encounter notes, upcoming appointments, etc.  Non-urgent messages can be sent to your provider as well.   To learn more about what you can do with MyChart, go to [email or web address].    Your next appointment:   12 month(s)  The format for your next appointment:   In Person  Provider:   Dr. Sallyanne Kuster  Important Information About Sugar

## 2021-11-21 ENCOUNTER — Other Ambulatory Visit: Payer: Self-pay | Admitting: Podiatry

## 2022-02-19 ENCOUNTER — Other Ambulatory Visit: Payer: Self-pay | Admitting: Cardiovascular Disease

## 2022-02-19 MED ORDER — AMLODIPINE BESYLATE 5 MG PO TABS: 5.0000 mg | ORAL_TABLET | Freq: Every day | ORAL | 3 refills | Status: DC

## 2022-03-19 ENCOUNTER — Other Ambulatory Visit: Payer: Self-pay | Admitting: *Deleted

## 2022-03-19 DIAGNOSIS — M79604 Pain in right leg: Secondary | ICD-10-CM

## 2022-03-21 NOTE — Progress Notes (Deleted)
Cardiology Office Note:    Date:  03/21/2022   ID:  Victoria Roberts, DOB November 11, 1965, MRN [id number]  PCP:  Leia Alf, PA-C  Cardiologist:  Sanda Klein, MD  Electrophysiologist:  None   Referring MD: Leia Alf, PA-C   Chief Complaint: follow-up of hypertension and hyperlipidemia   History of Present Illness:    Victoria Roberts is a 56 y.o. female with a history of chest pain with negative ETT in 07/2020, hypertension, hyperlipidemia, and prior tobacco use (quit in 2007) who is followed by Dr. Sallyanne Kuster and presents today for follow-up of hypertension and hyperlipidemia.   Patient was referred to Cardiology in 02/2020 for further evaluation of chest pain. She described this as central pressure and like someone was sitting on her stress. She had been under a lot of stress at work so she was not sure what was her heart and what was stress. She also reported feeling anxious and having a sensation of her heart racing. After discussion of ETT vs nuclear stress test vs and coronary CTA, decision was made to proceed with ETT. However, patient then decided not to pursue this. She was seen again by Dr. Harrell Gave in 06/2020 at which time she continue to report chest pain but this time described it as "feeling funny" and like "butterflies in her stomach. ETT was reordered and showed no evidence of ischemia. She is now seeing Dr. Sallyanne Kuster and was last seen in 10/2021. At that time, she denied any chest pain and was doing well from a cardiac standpoint. She had stopped taking her Crestor due to side effects. She was felt to likely have heterozygous familial hyperlipidemia. Dr. Sallyanne Kuster recommended trying an alternative agents such as Lipitor or an even better option would be Repatha or Praluent. He also recommended a coronary calcium score. Patient wanted to think about this first and do it after a planned trip to [place].  Patient presents today for follow-up. ***  Hyperlipidemia Felt to likely  have heterozygous familiar hyperlipidemia. Most recent lipid panel in 08/2021: Total Cholesterol 194, Triglycerides 81, HDL 78, LDL 103.  - Previously intolerant to Crestor.  - Dr. Sallyanne Kuster previously recommended trying an alternative agents such as Lipitor or an even better option would be Repatha or Praluent. He also recommended a coronary calcium score to reinforce the opinion that she needs lipid lowering therapy. Will order this today.  Hypertension BP well controlled. *** - Continue Amlodipine '5mg'$  daily.  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  Current Medications: No outpatient medications have been marked as taking for the 03/26/22 encounter (Appointment) with Darreld Mclean, PA-C.   Current Facility-Administered Medications for the 03/26/22 encounter (Appointment) with Darreld Mclean, PA-C  Medication   dexamethasone (DECADRON) injection 4 mg     Allergies:   Sulfamethoxazole   Social History   Socioeconomic History   Marital status: Married    Spouse name: Not on file   Number of children: Not on file   Years of education: Not on file   Highest education level: Not on file  Occupational  History   Not on file  Tobacco Use   Smoking status: Never   Smokeless tobacco: Never  Substance and Sexual Activity   Alcohol use: Yes   Drug use: Never   Sexual activity: Not on file  Other Topics Concern   Not on file  Social History Narrative   Not on file   Social Determinants of Health   Financial Resource Strain: Not on file  Food Insecurity: Not on file  Transportation  Needs: Not on file  Physical Activity: Not on file  Stress: Not on file  Social Connections: Not on file     Family History: The patient's family history includes Breast cancer in her maternal aunt and mother; Breast cancer (age of onset: 39) in her paternal aunt; Cancer (age of onset: 70) in her maternal grandfather; Cervical cancer (age of onset: 48) in her sister; Colon cancer (age of onset: 4) in her maternal grandmother; Colon cancer (age of onset: 71) in an other family member; Other in her mother; Ovarian cancer (age of onset: 54) in an other family member; Pancreatic cancer (age of onset: 95) in an other family member; Prostate cancer in her father; Uterine cancer (age of onset: 57) in an other family member.  ROS:   Please see the history of present illness.     EKGs/Labs/Other Studies Reviewed:    The following studies were reviewed:  Exercise Tolerance Test 07/25/2020: Good exercise capacity, achieved 10.8 METS Hypertensive response to exercise There was no ST segment deviation noted during stress. No evidence of ischemia on stress EKG  EKG:  EKG not ordered today.  Recent Labs: No results found for requested labs within last 365 days.  Recent Lipid Panel No results found for: "CHOL", "TRIG", "HDL", "CHOLHDL", "VLDL", "LDLCALC", "LDLDIRECT"  Physical Exam:    Vital Signs: There were no vitals taken for this visit.    Wt Readings from Last 3 Encounters:  11/13/21 203 lb 9.6 oz (92.4 kg)  05/26/21 202 lb 1.6 oz (91.7 kg)  07/15/20 212 lb 9.6 oz (96.4 kg)     General: 56 y.o. female in no acute distress. HEENT: Normocephalic and atraumatic. Sclera clear. EOMs intact. Neck: Supple. No carotid bruits. No JVD. Heart: *** RRR. Distinct S1 and S2. No murmurs, gallops, or rubs. Radial and distal pedal pulses 2+ and equal bilaterally. Lungs: No increased work of breathing. Clear to ausculation bilaterally. No wheezes, rhonchi, or rales.  Abdomen: Soft, non-distended, and  non-tender to palpation. Bowel sounds present in all 4 quadrants.  MSK: Normal strength and tone for age. *** Extremities: No lower extremity edema.    Skin: Warm and dry. Neuro: Alert and oriented x3. No focal deficits. Psych: Normal affect. Responds appropriately.   Assessment:    No diagnosis found.  Plan:     Disposition: Follow up in ***   Medication Adjustments/Labs and Tests Ordered: Current medicines are reviewed at length with the patient today.  Concerns regarding medicines are outlined above.  No orders of the defined types were placed in this encounter.  No orders of the defined types were placed in this encounter.   There are no Patient Instructions on file for this visit.   Signed, Darreld Mclean, PA-C  03/21/2022 4:01 PM    [facility]

## 2022-03-25 ENCOUNTER — Ambulatory Visit (HOSPITAL_COMMUNITY)
Admission: RE | Admit: 2022-03-25 | Discharge: 2022-03-25 | Disposition: A | Discharge status: Home / Self Care | Payer: BC Managed Care – PPO | Source: Ambulatory Visit | Attending: Vascular Surgery | Admitting: Vascular Surgery

## 2022-03-25 ENCOUNTER — Encounter: Payer: Self-pay | Admitting: Vascular Surgery

## 2022-03-25 ENCOUNTER — Ambulatory Visit: Payer: BC Managed Care – PPO | Admitting: Vascular Surgery

## 2022-03-25 VITALS — BP 122/81 | HR 73 | Temp 97.7°F | Resp 20 | Ht 67.0 in | Wt 201.0 lb

## 2022-03-25 DIAGNOSIS — M79605 Pain in left leg: Secondary | ICD-10-CM | POA: Diagnosis present

## 2022-03-25 DIAGNOSIS — I781 Nevus, non-neoplastic: Secondary | ICD-10-CM

## 2022-03-25 DIAGNOSIS — M79604 Pain in right leg: Secondary | ICD-10-CM | POA: Diagnosis present

## 2022-03-25 NOTE — Progress Notes (Signed)
Patient ID: Victoria Roberts, female   DOB: 1966/03/04, 56 y.o.   MRN: [id number]  Reason for Consult: No chief complaint on file.   Referred by Victoria Alf, PA-C  Subjective:     HPI:  Victoria Roberts is a 56 y.o. female history of leg swelling with a history of bilateral lower extremity laser ablation of her greater saphenous veins and more recently has undergone sclerotherapy last February in this office.  She follows up today for evaluation of greater saphenous vein patency.  She was having significant pain in her legs but when her statin was stopped this did improve.  She also quit drinking approximately 2 months ago she states that overall her swelling in her legs has improved with this 2.  She has worn compression stockings in the past particularly at the time of her venous interventions but does not wear them routinely.  She does not have any tissue loss or ulceration.  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)   Family history of BAP1 hereditary cancer mutation 08/28/2020   Family history of breast cancer 08/28/2020   Family history of colon cancer 08/28/2020   Family history of ovarian cancer 08/28/2020   Family history of prostate cancer 08/28/2020   Family history of uterine cancer 08/28/2020   Family History  Problem Relation Age of Onset   Breast cancer Mother        contralateral; dx 78; dx 61   Other Mother        BAP1 mutation    Cervical cancer Sister 31   Breast cancer Maternal Aunt        contralateral; dx 28; dx 68   Colon cancer Maternal Grandmother 43   Cancer Maternal Grandfather 90       unknown type   Ovarian cancer Other 55       MGF's sisters, x2   Pancreatic cancer Other 2       MGF's sister   Colon cancer Other 52       MGM's brother   Uterine cancer Other 78       MGM's sister   Prostate cancer Father        dx before 23   Breast cancer Paternal Aunt 3   No past surgical  history on file.  Short Social History:  Social History   Tobacco Use   Smoking status: Never   Smokeless tobacco: Never  Substance Use Topics   Alcohol use: Yes    Allergies  Allergen Reactions   Sulfamethoxazole Other (See Comments) and Rash    Heart races and skin crawls. Heart races and skin crawls.     Current Outpatient Medications  Medication Sig Dispense Refill   amLODipine (NORVASC) 5 MG tablet Take 1 tablet (5 mg total) by mouth daily. 90 tablet 3   cetirizine (ZYRTEC) 10 MG tablet Zyrtec 10 mg tablet  Take 1 tablet every day by oral route. (Patient not taking: Reported on 11/13/2021)     EPINEPHrine 0.3 mg/0.3 mL IJ SOAJ injection Inject 0.3 mg into the muscle as needed. (Patient not taking: Reported on 11/13/2021)     famotidine (PEPCID) 40 MG tablet Take 40 mg by mouth daily.     fluconazole (DIFLUCAN) 150 MG tablet Take 150 tablets by mouth as needed. (Patient not taking: Reported on 11/13/2021)     IRON-VITAMIN C PO Take by mouth. (Patient not taking: Reported on 11/13/2021)  mometasone (NASONEX) 50 MCG/ACT nasal spray Place 1 spray into the nose as needed.     Omega-3 Fatty Acids (FISH OIL OMEGA-3 PO) Take by mouth. (Patient not taking: Reported on 11/13/2021)     pantoprazole (PROTONIX) 40 MG tablet 1 tab(s)     triamcinolone cream (KENALOG) 0.1 % APPLY TO AFFECTED AREA TWICE A DAY (Patient not taking: Reported on 11/13/2021) 30 g 0   Vitamin D, Cholecalciferol, 50 MCG (2000 UT) CAPS Take 50 mcg by mouth once.     Current Facility-Administered Medications  Medication Dose Route Frequency Provider Last Rate Last Admin   dexamethasone (DECADRON) injection 4 mg  4 mg Intra-articular Once Lorenda Peck, DPM        Review of Systems  Constitutional:  Constitutional negative. HENT: HENT negative.  Eyes: Eyes negative.  Respiratory: Respiratory negative.  Cardiovascular: Positive for leg swelling.  GI: Gastrointestinal negative.  Musculoskeletal:  Musculoskeletal negative.  Skin:       Spider veins Neurological: Neurological negative. Hematologic: Hematologic/lymphatic negative.        Objective:  Objective  Vitals:   03/25/22 1519  BP: 122/81  Pulse: 73  Resp: 20  Temp: 97.7 F (36.5 C)  SpO2: 94%    Physical Exam HENT:     Head: Normocephalic.     Nose: Nose normal.  Eyes:     Pupils: Pupils are equal, round, and reactive to light.  Cardiovascular:     Rate and Rhythm: Normal rate.     Pulses: Normal pulses.  Abdominal:     General: Abdomen is flat.     Palpations: Abdomen is soft.  Musculoskeletal:     Cervical back: Normal range of motion.     Right lower leg: No edema.     Left lower leg: No edema.  Skin:    General: Skin is warm.     Capillary Refill: Capillary refill takes less than 2 seconds.     Comments: There are spider telangiectasias on the right medial knee and on the left lateral knee and bilateral lower legs sporadically  Neurological:     General: No focal deficit present.     Mental Status: She is alert.  Psychiatric:        Mood and Affect: Mood normal.        Thought Content: Thought content normal.        Judgment: Judgment normal.     Data: Venous Reflux Times  +--------------+---------+------+-----------+------------+-----------------  ----+  RIGHT        Reflux NoRefluxReflux TimeDiameter cmsComments                                        Yes                                                 +--------------+---------+------+-----------+------------+-----------------  ----+  CFV          no                                                            +--------------+---------+------+-----------+------------+-----------------  ----+  FV mid        no                                                            +--------------+---------+------+-----------+------------+-----------------  ----+  Popliteal    no                                                             +--------------+---------+------+-----------+------------+-----------------  ----+  GSV at [facility]              yes    >500 ms              prior ablation  just                                                        distal to SFJ           +--------------+---------+------+-----------+------------+-----------------  ----+  GSV prox thigh                                      prior                                                                        ablation/stripping     +--------------+---------+------+-----------+------------+-----------------  ----+  GSV mid thigh                                       prior                                                                        ablation/stripping     +--------------+---------+------+-----------+------------+-----------------  ----+  GSV dist thigh                                      prior  ablation/stripping     +--------------+---------+------+-----------+------------+-----------------  ----+  GSV at knee                                         prior                                                                        ablation/stripping     +--------------+---------+------+-----------+------------+-----------------  ----+  GSV prox calf                                       NWV                     +--------------+---------+------+-----------+------------+-----------------  ----+  GSV mid calf                                        NWV                     +--------------+---------+------+-----------+------------+-----------------  ----+  SSV Pop Fossa no                            0.18                            +--------------+---------+------+-----------+------------+-----------------  ----+  SSV prox calf                                        NWV                     +--------------+---------+------+-----------+------------+-----------------  ----+  SSV mid calf                                        NWV                     +--------------+---------+------+-----------+------------+-----------------  ----+  AASV         no                            0.39                            +--------------+---------+------+-----------+------------+-----------------  ----+        Summary:  Right:  - No evidence of deep vein thrombosis seen in the right lower extremity,  from the common femoral through the popliteal veins.  - No evidence of superficial venous thrombosis in the small saphenous  vein. The great saphenous vein was previously ablated.   - The deep venous system is competent.  - The great saphenous vein  is incompetent at the [facility], however is ablated  just distal to this.  - The small saphenous vein is competent.         Assessment/Plan:    56 year old female with venous treatment as noted above with satisfactory ablation of the right greater saphenous vein and I would assume the left as well.  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  I recommended her and she called Estell Harpin, RN tomorrow to discuss possible treatment options which are complicated by her pending trip to [place] next month which will have significant sun exposure.  Patient can see me on an as-needed basis.     Waynetta Sandy MD [facility]

## 2022-03-26 ENCOUNTER — Ambulatory Visit: Payer: BC Managed Care – PPO | Admitting: Student

## 2022-03-27 ENCOUNTER — Ambulatory Visit: Payer: BC Managed Care – PPO

## 2022-05-29 ENCOUNTER — Ambulatory Visit: Payer: BC Managed Care – PPO

## 2022-10-02 ENCOUNTER — Ambulatory Visit: Payer: Commercial Managed Care - HMO | Admitting: Podiatry

## 2022-10-02 ENCOUNTER — Ambulatory Visit (INDEPENDENT_AMBULATORY_CARE_PROVIDER_SITE_OTHER): Payer: Commercial Managed Care - HMO

## 2022-10-02 DIAGNOSIS — M778 Other enthesopathies, not elsewhere classified: Secondary | ICD-10-CM

## 2022-10-02 DIAGNOSIS — L923 Foreign body granuloma of the skin and subcutaneous tissue: Secondary | ICD-10-CM | POA: Diagnosis not present

## 2022-10-04 NOTE — Progress Notes (Signed)
Chief Complaint  Patient presents with   Foot Pain    right heel possible foreign object    HPI: 57 y.o. female presenting today with concern of a possible foreign body in the right heel.  States that it feels like there could be a splinter or something in the bottom of her heel.  Denies drainage or bleeding.  She is able to feel a hard spot when she feels the bottom of her foot.  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 Heart races and skin crawls.       Physical Exam:  General: The patient is alert and oriented x3 in no acute distress.  Dermatology: Skin is warm, dry and supple bilateral lower extremities. Interspaces are clear of maceration and debris.  There is a focal hyperkeratotic lesion with a small entry point identified in the central plantar aspect of the right heel.  No surrounding erythema or drainage is noted.  No ecchymosis is seen.  Vascular: Palpable pedal pulses bilaterally. Capillary refill within normal limits.  No appreciable edema.  No erythema or calor.  Neurological: Light touch sensation grossly intact bilateral feet.   Musculoskeletal Exam: No pedal deformities noted  Radiographic Exam (3 weightbearing views right foot 10/02/2022):  Normal osseous mineralization. Joint spaces preserved.  No  fractures or osseous irregularities noted.  No evidence of retained radiopaque foreign body in the plantar aspect of the right heel.  Assessment/Plan of Care: 1. Foreign body granuloma of skin and subcutaneous tissue     Discussed clinical findings with patient today.  With the patient's consent the lesion was shaved with a sterile #312 blade.  During debridement and exploration of the skin and soft tissue, a small retained hair was able to be visualized and removed from the heel.  Further debridement did not reveal any additional foreign body.  Once the hyperkeratotic tissue was removed, 1 application of Cantharone Plus was applied to dissolve the foreign body granuloma/inclusion cyst.  A Band-Aid was applied.  She can remove this in approximately 5 hours.  No further treatment needed.  Informed the patient that if she still has discomfort to the area or any signs of infection to call the office.  She was also informed that the area may produce another hard lesion in the same area within the next few weeks that may need to be retreated.  She can call the office for an appointment if that occurs.  Otherwise follow-up as needed.   Clerance Lav, DPM, FACFAS [facility]     [address]  [place], [place] [address]                Office [phone]  Fax [phone]

## 2022-10-17 IMAGING — DX DG FOOT COMPLETE 3+V*L*
3 series · 3 of 3 positions shown · non-contrast
Comparison: None.

CLINICAL DATA: Heel pain.

EXAM:
LEFT FOOT - COMPLETE 3+ VIEW

[foot ap wb]
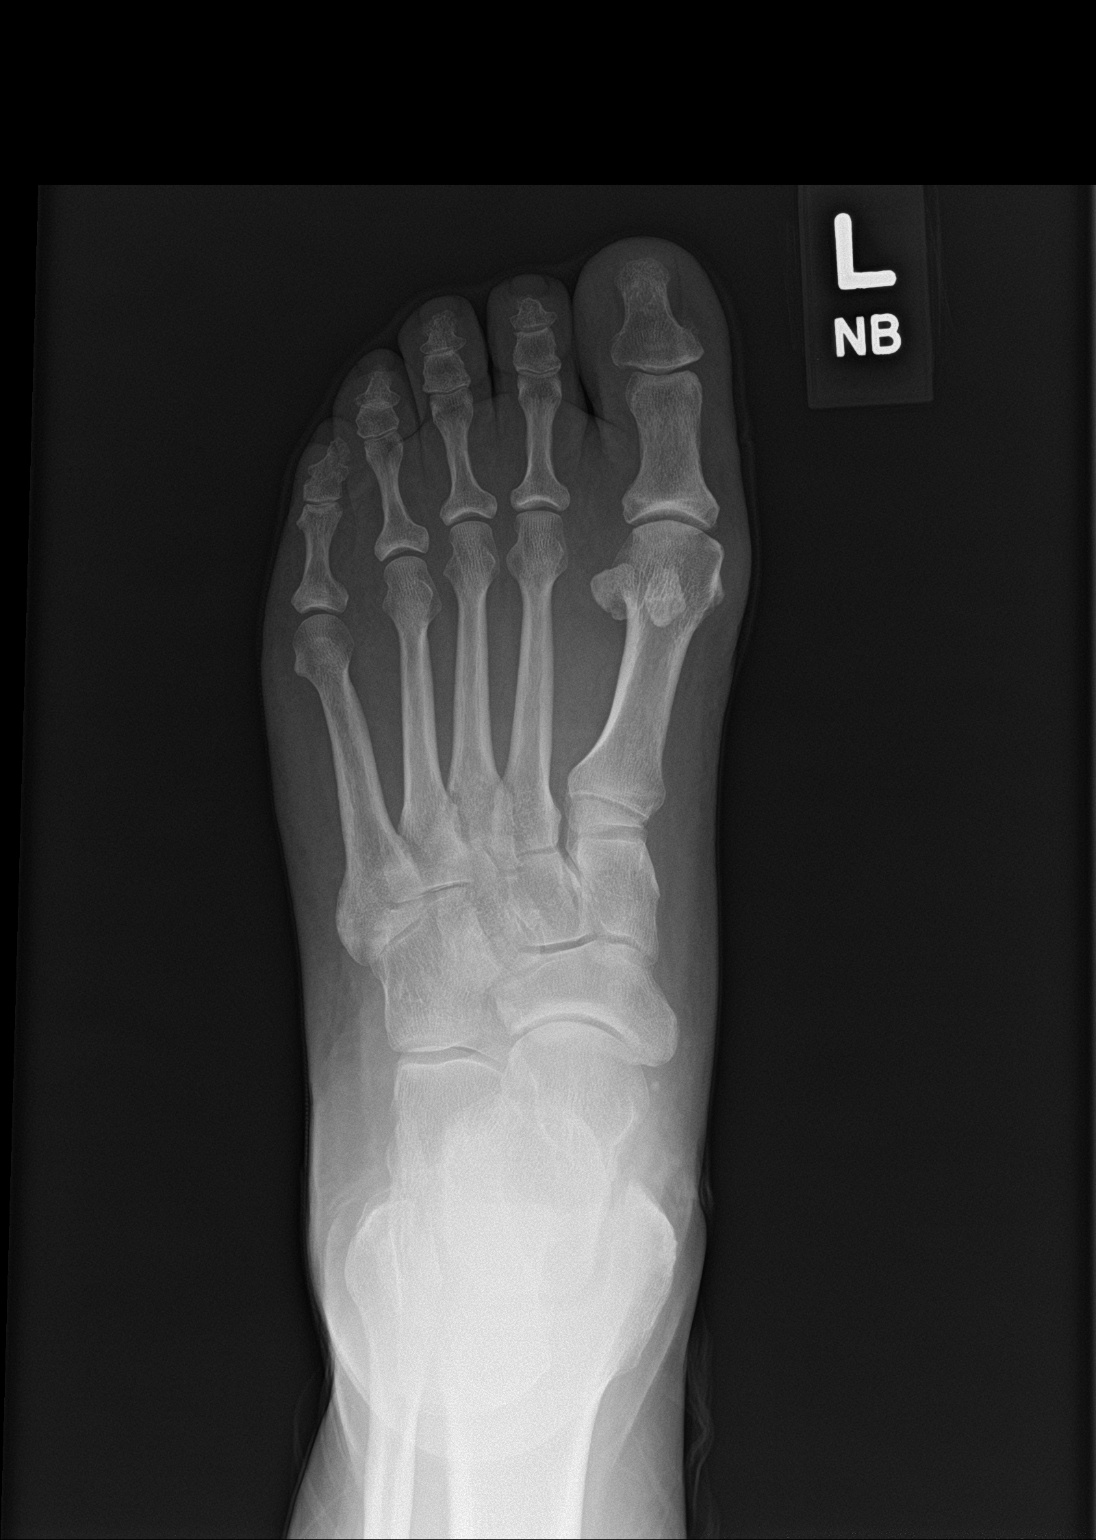

[foot obl wb]
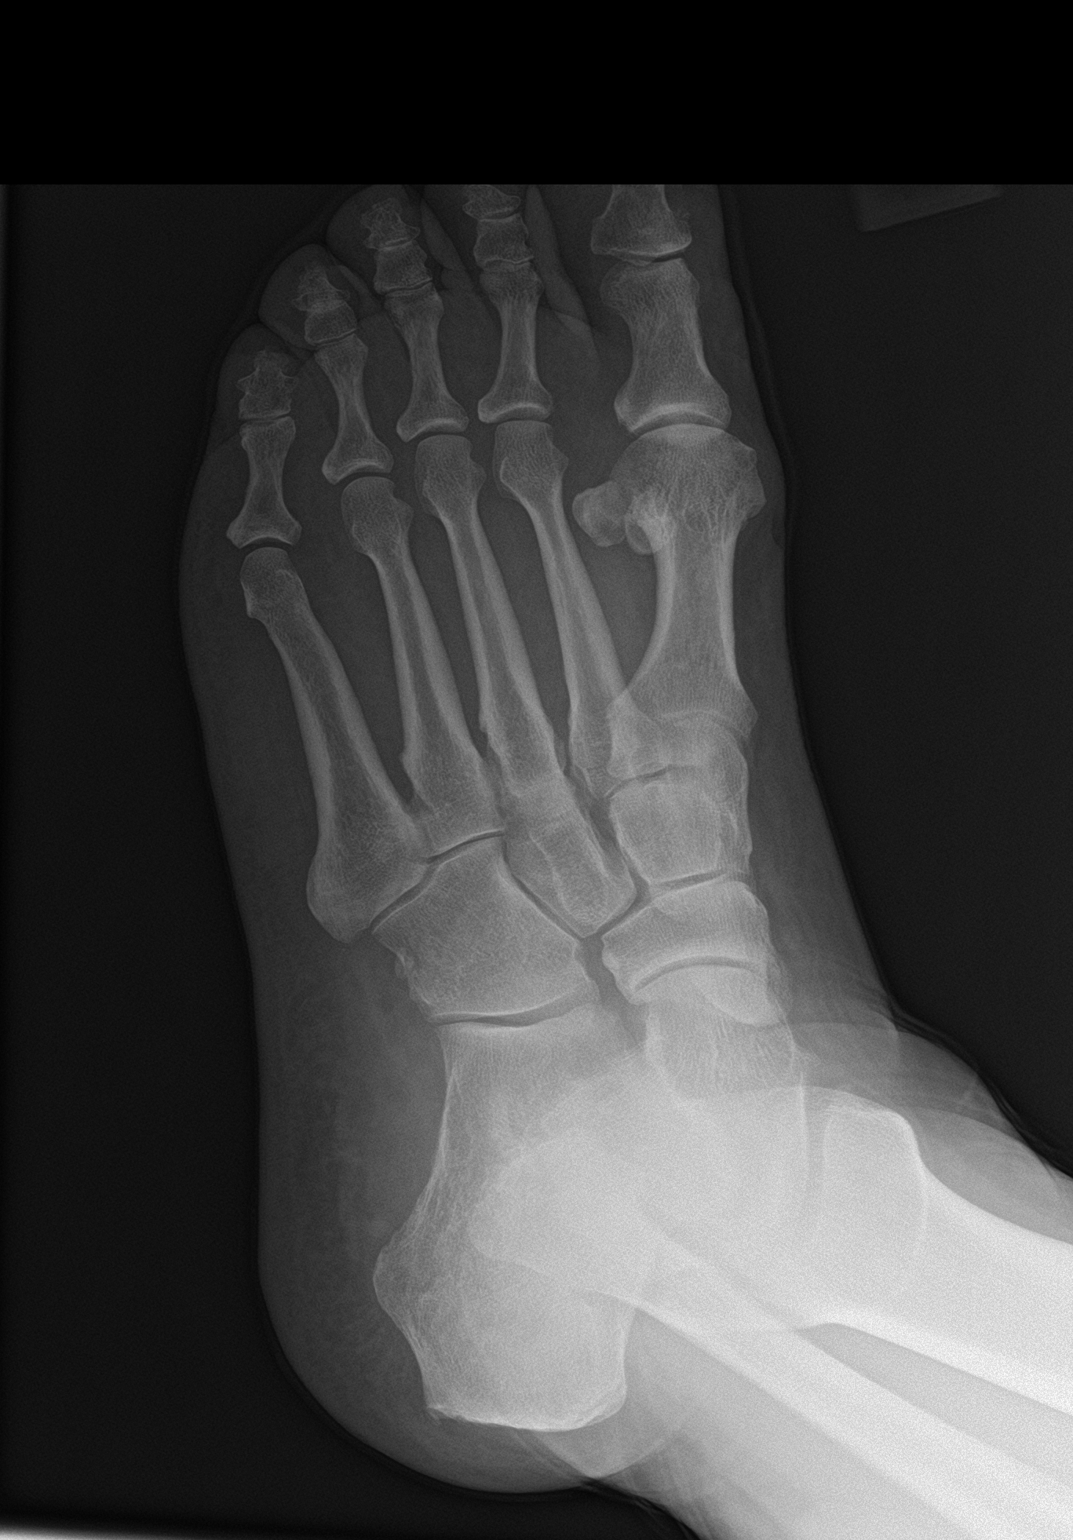

[foot lat wb]
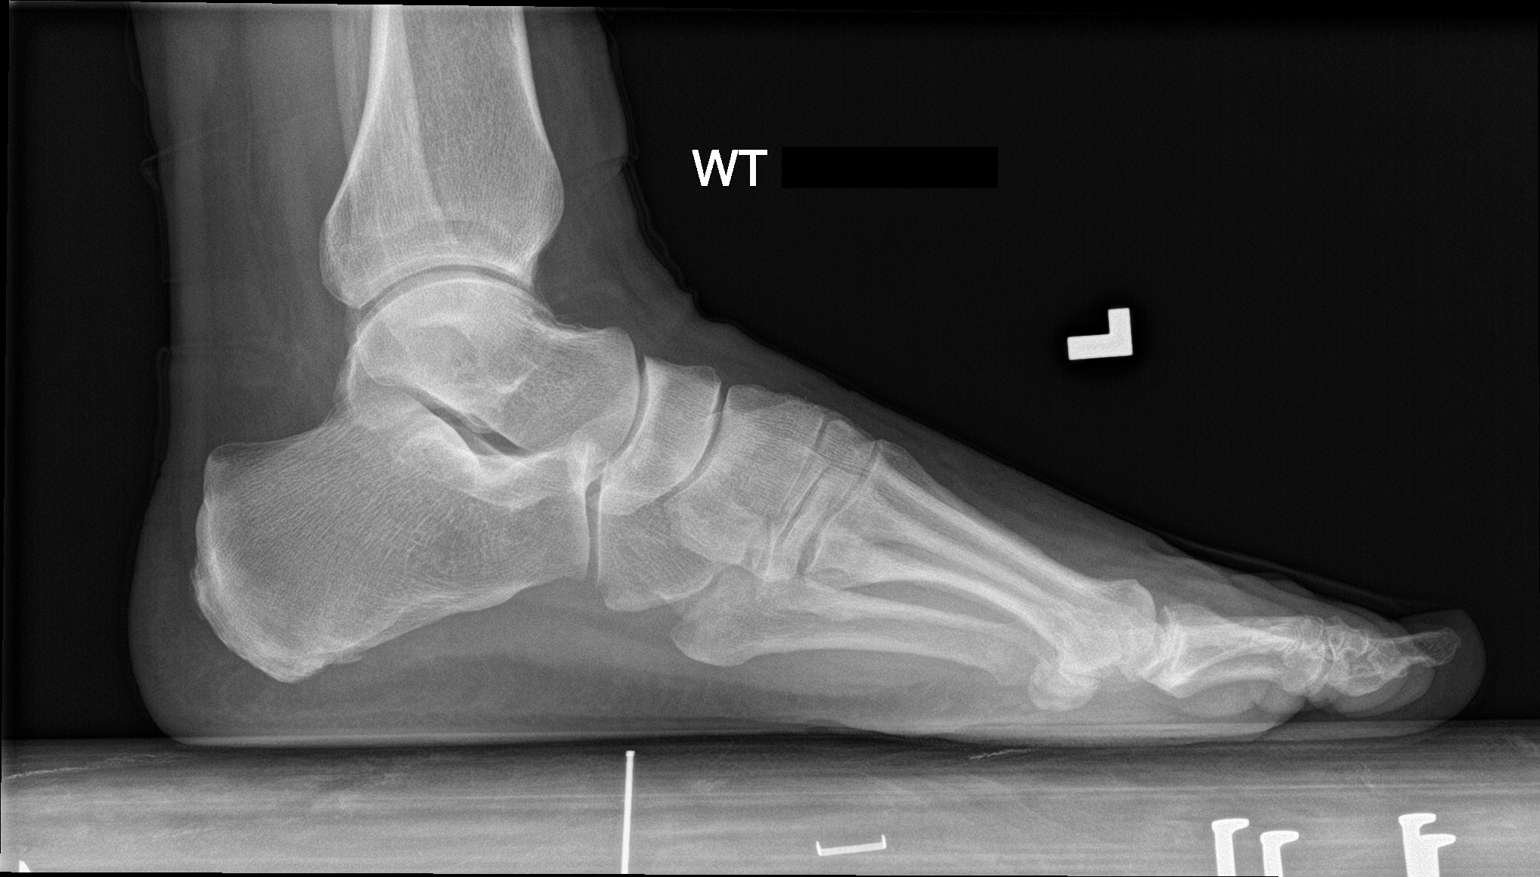

[3 of 3 positions shown; findings below may reference images not displayed]

FINDINGS: Normal anatomic alignment. No evidence for acute fracture or
dislocation. Mild first MTP joint degenerative changes. Mild plantar
calcaneal spurring.
IMPRESSION: Mild plantar calcaneal spurring.

## 2022-12-10 ENCOUNTER — Telehealth: Payer: Self-pay | Admitting: Cardiovascular Disease

## 2022-12-10 NOTE — Telephone Encounter (Signed)
Pt would like a callback regarding provider that specializes in Lipoprotein A, that Dr. Salena Saner mentioned to her husband. Please advise

## 2022-12-10 NOTE — Telephone Encounter (Signed)
Cancelled appt with Dr. Salena Saner and scheduled with Dr. Rennis Golden.

## 2022-12-10 NOTE — Telephone Encounter (Signed)
Returned call to pt. Pt wants to see the provider in the office that specialist that Dr. Salena Saner recommended for her Lipoprotein A. Pt has an appointment with Dr.C in October so she would like to switch.

## 2023-01-19 ENCOUNTER — Ambulatory Visit: Payer: Self-pay | Admitting: Cardiovascular Disease

## 2023-01-29 ENCOUNTER — Ambulatory Visit: Payer: Managed Care, Other (non HMO) | Admitting: Podiatry

## 2023-01-29 ENCOUNTER — Encounter: Payer: Self-pay | Admitting: Podiatry

## 2023-01-29 DIAGNOSIS — D2372 Other benign neoplasm of skin of left lower limb, including hip: Secondary | ICD-10-CM | POA: Diagnosis not present

## 2023-01-29 NOTE — Progress Notes (Signed)
  Subjective:  Patient ID: Victoria Roberts, female    DOB: 10-Apr-1966,   MRN: [id number]  No chief complaint on file.   57 y.o. female presents for concern now of pain in her left heel but different from the plantar fasciitis. (Does relates the fasciitis went away after stopping her statin).  Relates several months ago she was seen by Dr. Carlota Raspberry for a possible foreign body and relates now the left heel has a similar issue and has been very painful. Denies any other pedal complaints. Denies n/v/f/c.   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 CFT <3 seconds. Normal hair growth on digits. No edema.  Skin. No lacerations or abrasions bilateral feet. Hyperkeratotic cored lesion noted to plantar left heel with disruption of skin lines.  Musculoskeletal: MMT 5/5 bilateral lower extremities in DF, PF, Inversion and Eversion. Deceased ROM in DF of ankle joint.  Neurological: Sensation intact to light touch.   Assessment:   1. Benign neoplasm of skin of foot, left      Plan:  Patient was evaluated and treated and all questions answered. -Discussed benign skin lesions with patient and treatment options. This is a new and separate issue from prior.  -Hyperkeratotic tissue was debrided with chisel without incident.  -Applied salycylic acid treatment to area with dressing. Advised to remove bandaging tomorrow.  -Encouraged daily moisturizing -Discussed use of pumice stone -Advised good supportive shoes and inserts -Patient to return to office  as needed or sooner if condition worsens.   Louann Sjogren, DPM

## 2023-03-18 ENCOUNTER — Other Ambulatory Visit: Payer: Self-pay | Admitting: Cardiovascular Disease

## 2023-04-26 ENCOUNTER — Ambulatory Visit: Payer: Self-pay | Admitting: Internal Medicine

## 2023-05-28 ENCOUNTER — Telehealth: Payer: Self-pay

## 2023-05-28 NOTE — Telephone Encounter (Signed)
 Returned pt's call regarding scheduling a sclerotherapy appt. LVM for her to return our call at her convenience.

## 2023-05-31 DIAGNOSIS — L720 Epidermal cyst: Secondary | ICD-10-CM | POA: Diagnosis not present

## 2023-06-04 ENCOUNTER — Encounter: Payer: Self-pay | Admitting: Internal Medicine

## 2023-06-04 ENCOUNTER — Ambulatory Visit: Payer: Self-pay | Attending: Internal Medicine | Admitting: Internal Medicine

## 2023-06-04 VITALS — BP 118/74 | HR 76 | Ht 66.0 in | Wt 203.0 lb

## 2023-06-04 DIAGNOSIS — E7841 Elevated Lipoprotein(a): Secondary | ICD-10-CM | POA: Diagnosis not present

## 2023-06-04 DIAGNOSIS — E7801 Familial hypercholesterolemia: Secondary | ICD-10-CM

## 2023-06-04 NOTE — Patient Instructions (Addendum)
Medication Instructions:  Your physician recommends that you continue on your current medications as directed. Please refer to the Current Medication list given to you today.  *If you need a refill on your cardiac medications before your next appointment, please call your pharmacy*  Lab Work: None  Testing/Procedures: Dr. Rennis Golden has ordered a CT coronary calcium score.   Test locations:  [facility]  [facility]  This is $99 out of pocket.   Coronary CalciumScan A coronary calcium scan is an imaging test used to look for deposits of calcium and other fatty materials (plaques) in the inner lining of the blood vessels of the heart (coronary arteries). These deposits of calcium and plaques can partly clog and narrow the coronary arteries without producing any symptoms or warning signs. This puts a person at risk for a heart attack. This test can detect these deposits before symptoms develop. Tell a health care provider about: Any allergies you have. All medicines you are taking, including vitamins, herbs, eye drops, creams, and over-the-counter medicines. Any problems you or family members have had with anesthetic medicines. Any blood disorders you have. Any surgeries you have had. Any medical conditions you have. Whether you are pregnant or may be pregnant. What are the risks? Generally, this is a safe procedure. However, problems may occur, including: Harm to a pregnant woman and her unborn baby. This test involves the use of radiation. Radiation exposure can be dangerous to a pregnant woman and her unborn baby. If you are pregnant, you generally should not have this procedure done. Slight increase in the risk of cancer. This is because of the radiation involved in the test. What happens before the procedure? No preparation is needed for this procedure. What happens during the procedure? You  will undress and remove any jewelry around your neck or chest. You will put on a hospital gown. Sticky electrodes will be placed on your chest. The electrodes will be connected to an electrocardiogram (ECG) machine to record a tracing of the electrical activity of your heart. A CT scanner will take pictures of your heart. During this time, you will be asked to lie still and hold your breath for 2-3 seconds while a picture of your heart is being taken. The procedure may vary among health care providers and hospitals. What happens after the procedure? You can get dressed. You can return to your normal activities. It is up to you to get the results of your test. Ask your health care provider, or the department that is doing the test, when your results will be ready. Summary A coronary calcium scan is an imaging test used to look for deposits of calcium and other fatty materials (plaques) in the inner lining of the blood vessels of the heart (coronary arteries). Generally, this is a safe procedure. Tell your health care provider if you are pregnant or may be pregnant. No preparation is needed for this procedure. A CT scanner will take pictures of your heart. You can return to your normal activities after the scan is done. This information is not intended to replace advice given to you by your health care provider. Make sure you discuss any questions you have with your health care provider. Document Released: 10/03/2007 Document Revised: 02/24/2016 Document Reviewed: 02/24/2016 Elsevier Interactive Patient Education  2017 [employer].    Follow-Up: At [facility], you and your health needs are our priority.  As part  of our continuing mission to provide you with exceptional heart care, we have created designated Provider Care Teams.  These Care Teams include your primary Cardiologist (physician) and Advanced Practice Providers (APPs -  Physician Assistants and Nurse Practitioners) who all  work together to provide you with the care you need, when you need it.   Your next appointment:    We will follow up with you pending results of the tests for follow up with Dr. Rennis Golden.  Provider:   Chrystie Nose, MD     Other Instructions You can research these common Lipid medications:  Repatha Praluent  Leqvio  You can also research the option of Plasmapheresis

## 2023-06-06 NOTE — Progress Notes (Signed)
OFFICE NOTE  Chief Complaint:  Elevated LP(a)  Primary Care Physician: Laroy Apple, PA-C  HPI:  Victoria Roberts is a 58 y.o. female with a past medial history significant for dyslipidemia, probably familial hyperlipidemia and an elevated LP(a).  She has previously seen my partners Dr. Cristal Deer and most recently Dr. Royann Shivers for these issues as well as some chest pain in the past.  She was ordered to have a CT coronary angiogram however that was never performed.  Today she is inquiring about a calcium score.  She has a high LP(a) of 258.9 nmol/L.  This was assessed in May of this year.  Had a lipoprotein which showed a particle number of 1630 although her LDL size was large and small LDL particle number was 304.  While this is more favorable profile, her high LP(a) is concerning.  In addition her family history of heart disease is concerning as well.  Her grandfather was a World War II veteran and patient of Dr. C with heart disease as well.  Her father had his first MI at age 36 and died at age 81.  She was trialed on rosuvastatin which she said caused significant intolerance and flulike symptoms.  Based on that she is not interested in trying other statins.  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)   Family history of BAP1 hereditary cancer mutation 08/28/2020   Family history of breast cancer 08/28/2020   Family history of colon cancer 08/28/2020   Family history of ovarian cancer 08/28/2020   Family history of prostate cancer 08/28/2020   Family history of uterine cancer 08/28/2020   Hyperlipidemia    Hypertension     Past Surgical History:  Procedure Laterality Date   CESAREAN SECTION     ENDOVENOUS ABLATION SAPHENOUS VEIN W/ LASER Bilateral    Vein Clinic    FAMHx:  Family History  Problem Relation Age of Onset   Breast cancer Mother        contralateral; dx 62; dx 6   Other Mother         BAP1 mutation    Cervical cancer Sister 54   Breast cancer Maternal Aunt        contralateral; dx 36; dx 68   Colon cancer Maternal Grandmother 38   Cancer Maternal Grandfather 15       unknown type   Ovarian cancer Other 91       MGF's sisters, x2   Pancreatic cancer Other 47       MGF's sister   Colon cancer Other 7       MGM's brother   Uterine cancer Other 70       MGM's sister   Prostate cancer Father        dx before 4   Breast cancer Paternal Aunt 14    SOCHx:   reports that she has never smoked. She has never used smokeless tobacco. She reports current alcohol use. She reports that she does not use drugs.  ALLERGIES:  Allergies  Allergen Reactions   Sulfamethoxazole Other (See Comments) and Rash    Heart races and skin crawls. Heart races and skin crawls.     ROS: Pertinent items noted in HPI and remainder of comprehensive ROS otherwise negative.  HOME MEDS: Current Outpatient Medications on File Prior to Visit  Medication Sig Dispense Refill   amLODipine (NORVASC) 5 MG tablet Take 1 tablet (  5 mg total) by mouth daily. PLEASE KEEP UPCOMING APPOINTMENT IN ORDER TO RECEIVE FUTURE REFILLS. 30 tablet 3   cetirizine (ZYRTEC) 10 MG tablet Take 10 mg by mouth as needed.     EPINEPHrine 0.3 mg/0.3 mL IJ SOAJ injection Inject 0.3 mg into the muscle as needed.     IRON-VITAMIN C PO Take by mouth.     mometasone (NASONEX) 50 MCG/ACT nasal spray Place 1 spray into the nose as needed.     Multiple Vitamin (MULTIVITAMIN ADULT PO) Take 1 tablet by mouth daily.     Omega-3 Fatty Acids (FISH OIL OMEGA-3 PO) Take by mouth.     pantoprazole (PROTONIX) 40 MG tablet 1 tab(s)     Current Facility-Administered Medications on File Prior to Visit  Medication Dose Route Frequency Provider Last Rate Last Admin   dexamethasone (DECADRON) injection 4 mg  4 mg Intra-articular Once Louann Sjogren, DPM        LABS/IMAGING: No results found for this or any previous visit (from the past  48 hours). No results found.  LIPID PANEL: No results found for: "CHOL", "TRIG", "HDL", "CHOLHDL", "VLDL", "LDLCALC", "LDLDIRECT"   WEIGHTS: Wt Readings from Last 3 Encounters:  06/04/23 203 lb (92.1 kg)  03/25/22 201 lb (91.2 kg)  11/13/21 203 lb 9.6 oz (92.4 kg)    VITALS: BP 118/74 (BP Location: Left Arm, Patient Position: Sitting, Cuff Size: Normal)   Pulse 76   Ht 5\' 6"  (1.676 m)   Wt 203 lb (92.1 kg)   BMI 32.77 kg/m   EXAM: Deferred  EKG: EKG Interpretation Date/Time:  Friday June 04 2023 16:16:24 EST Ventricular Rate:  76 PR Interval:  146 QRS Duration:  80 QT Interval:  396 QTC Calculation: 445 R Axis:   7  Text Interpretation: Normal sinus rhythm Normal ECG No significant change since last tracing Confirmed by Zoila Shutter [id number]) on 06/04/2023 4:27:47 PM    ASSESSMENT: Probable familial hyperlipidemia based on [employer] criteria, with an untreated LDL as high as 215 mg/dL in the past. Elevated QM(V)-784.6 nmol/L (08/2022) Early onset heart disease in her father and heart disease in her grandfather Statin intolerant-myalgias  PLAN: 1.   Victoria Roberts has a probable familial hyperlipidemia and primarily a high LP(a).  There is early onset heart disease in her father and her grandfather and although her lipid profile does show a low number of small dense LDL particles primarily higher LDL size, I think the LP(a) is driving her risk and it should not be underestimated.  She had tried rosuvastatin but had side effects and was not interested in trying other statins.  Based on this would consider alternative such as a PCSK9 inhibitor since they may benefit her in lowering LP(a) as well.  She does want to consider her options including Praluent, Repatha and Leqvio.  We also discussed plasmapheresis since this is something she asked about.  It is the only FDA approved therapy to lower LP(a) but is quite intensive and not offered at our institution.  For this she  would need to be referred out and typically I would recommend sending patients to Dr. Mariane Baumgarten at [facility].  Will also pursue a calcium score today.  This may help her better define whether she wants to start treatment for her lipids.  Follow-up with me as needed based on her decision on treatment or not.    Chrystie Nose, MD, [facility], FACP  [place]  [facility]  Medical Director of  the [facility] Diplomate of the American Board of Clinical Lipidology Attending Cardiologist  Direct Dial: [phone]  Fax: [phone]  Website:  [email or web address] 06/06/2023, 3:46 PM

## 2023-06-15 DIAGNOSIS — D485 Neoplasm of uncertain behavior of skin: Secondary | ICD-10-CM | POA: Diagnosis not present

## 2023-06-15 DIAGNOSIS — L720 Epidermal cyst: Secondary | ICD-10-CM | POA: Diagnosis not present

## 2023-06-22 ENCOUNTER — Other Ambulatory Visit: Payer: Self-pay | Admitting: Cardiovascular Disease

## 2023-06-24 DIAGNOSIS — L920 Granuloma annulare: Secondary | ICD-10-CM | POA: Diagnosis not present

## 2023-06-24 DIAGNOSIS — L720 Epidermal cyst: Secondary | ICD-10-CM | POA: Diagnosis not present

## 2023-06-25 ENCOUNTER — Ambulatory Visit (INDEPENDENT_AMBULATORY_CARE_PROVIDER_SITE_OTHER): Payer: Self-pay

## 2023-06-25 DIAGNOSIS — I8393 Asymptomatic varicose veins of bilateral lower extremities: Secondary | ICD-10-CM

## 2023-06-25 NOTE — Progress Notes (Signed)
 Treated pt's BLE spider and small reticular veins with Asclera 1%, administered with a 27 gauge butterfly needle. Pt received a total of 4 mL/40 mg of Asclera 1%. She tolerated well. Easy access. She has some upcoming trips this spring but is interested in doing another 1-2 vials next Dec/Jan. She was placed in 20-30 mm Hg thigh high compression hose and was given post treatment care instructions on handout and verbally. She will call if she has any questions/concerns.

## 2023-10-13 DIAGNOSIS — Z01419 Encounter for gynecological examination (general) (routine) without abnormal findings: Secondary | ICD-10-CM | POA: Diagnosis not present

## 2023-10-13 DIAGNOSIS — Z124 Encounter for screening for malignant neoplasm of cervix: Secondary | ICD-10-CM | POA: Diagnosis not present

## 2023-10-14 DIAGNOSIS — Z6832 Body mass index (BMI) 32.0-32.9, adult: Secondary | ICD-10-CM | POA: Diagnosis not present

## 2023-10-14 DIAGNOSIS — E782 Mixed hyperlipidemia: Secondary | ICD-10-CM | POA: Diagnosis not present

## 2023-10-14 DIAGNOSIS — I2510 Atherosclerotic heart disease of native coronary artery without angina pectoris: Secondary | ICD-10-CM | POA: Diagnosis not present

## 2023-10-14 DIAGNOSIS — E559 Vitamin D deficiency, unspecified: Secondary | ICD-10-CM | POA: Diagnosis not present

## 2023-10-14 DIAGNOSIS — E66811 Obesity, class 1: Secondary | ICD-10-CM | POA: Diagnosis not present

## 2023-10-14 DIAGNOSIS — I10 Essential (primary) hypertension: Secondary | ICD-10-CM | POA: Diagnosis not present

## 2023-10-14 DIAGNOSIS — J309 Allergic rhinitis, unspecified: Secondary | ICD-10-CM | POA: Diagnosis not present

## 2023-10-14 DIAGNOSIS — K219 Gastro-esophageal reflux disease without esophagitis: Secondary | ICD-10-CM | POA: Diagnosis not present

## 2023-10-14 DIAGNOSIS — E6609 Other obesity due to excess calories: Secondary | ICD-10-CM | POA: Diagnosis not present

## 2023-10-14 DIAGNOSIS — Z1159 Encounter for screening for other viral diseases: Secondary | ICD-10-CM | POA: Diagnosis not present

## 2023-10-14 DIAGNOSIS — Z0000 Encounter for general adult medical examination without abnormal findings: Secondary | ICD-10-CM | POA: Diagnosis not present

## 2023-10-18 DIAGNOSIS — L821 Other seborrheic keratosis: Secondary | ICD-10-CM | POA: Diagnosis not present

## 2023-10-18 DIAGNOSIS — L814 Other melanin hyperpigmentation: Secondary | ICD-10-CM | POA: Diagnosis not present

## 2023-10-18 DIAGNOSIS — D1801 Hemangioma of skin and subcutaneous tissue: Secondary | ICD-10-CM | POA: Diagnosis not present

## 2023-10-18 DIAGNOSIS — L573 Poikiloderma of Civatte: Secondary | ICD-10-CM | POA: Diagnosis not present

## 2023-11-01 DIAGNOSIS — Z1231 Encounter for screening mammogram for malignant neoplasm of breast: Secondary | ICD-10-CM | POA: Diagnosis not present

## 2023-11-18 DIAGNOSIS — E6609 Other obesity due to excess calories: Secondary | ICD-10-CM | POA: Diagnosis not present

## 2023-11-18 DIAGNOSIS — I2510 Atherosclerotic heart disease of native coronary artery without angina pectoris: Secondary | ICD-10-CM | POA: Diagnosis not present

## 2023-11-18 DIAGNOSIS — K219 Gastro-esophageal reflux disease without esophagitis: Secondary | ICD-10-CM | POA: Diagnosis not present

## 2023-11-18 DIAGNOSIS — E7841 Elevated Lipoprotein(a): Secondary | ICD-10-CM | POA: Diagnosis not present

## 2023-11-18 DIAGNOSIS — E66811 Obesity, class 1: Secondary | ICD-10-CM | POA: Diagnosis not present

## 2023-11-18 DIAGNOSIS — Z6832 Body mass index (BMI) 32.0-32.9, adult: Secondary | ICD-10-CM | POA: Diagnosis not present

## 2023-11-18 DIAGNOSIS — E782 Mixed hyperlipidemia: Secondary | ICD-10-CM | POA: Diagnosis not present

## 2023-12-09 DIAGNOSIS — B353 Tinea pedis: Secondary | ICD-10-CM | POA: Diagnosis not present

## 2023-12-09 DIAGNOSIS — L270 Generalized skin eruption due to drugs and medicaments taken internally: Secondary | ICD-10-CM | POA: Diagnosis not present

## 2024-01-13 DIAGNOSIS — E66811 Obesity, class 1: Secondary | ICD-10-CM | POA: Diagnosis not present

## 2024-01-13 DIAGNOSIS — E6609 Other obesity due to excess calories: Secondary | ICD-10-CM | POA: Diagnosis not present

## 2024-01-13 DIAGNOSIS — Z6832 Body mass index (BMI) 32.0-32.9, adult: Secondary | ICD-10-CM | POA: Diagnosis not present

## 2024-03-03 DIAGNOSIS — K219 Gastro-esophageal reflux disease without esophagitis: Secondary | ICD-10-CM | POA: Diagnosis not present

## 2024-03-03 DIAGNOSIS — E6609 Other obesity due to excess calories: Secondary | ICD-10-CM | POA: Diagnosis not present

## 2024-03-03 DIAGNOSIS — E782 Mixed hyperlipidemia: Secondary | ICD-10-CM | POA: Diagnosis not present

## 2024-03-03 DIAGNOSIS — E66811 Obesity, class 1: Secondary | ICD-10-CM | POA: Diagnosis not present

## 2024-03-03 DIAGNOSIS — Z7184 Encounter for health counseling related to travel: Secondary | ICD-10-CM | POA: Diagnosis not present

## 2024-03-03 DIAGNOSIS — Z6832 Body mass index (BMI) 32.0-32.9, adult: Secondary | ICD-10-CM | POA: Diagnosis not present

## 2024-03-30 DIAGNOSIS — E66811 Obesity, class 1: Secondary | ICD-10-CM | POA: Diagnosis not present

## 2024-03-30 DIAGNOSIS — Z6832 Body mass index (BMI) 32.0-32.9, adult: Secondary | ICD-10-CM | POA: Diagnosis not present

## 2024-03-30 DIAGNOSIS — E6609 Other obesity due to excess calories: Secondary | ICD-10-CM | POA: Diagnosis not present

## 2024-03-30 DIAGNOSIS — R1013 Epigastric pain: Secondary | ICD-10-CM | POA: Diagnosis not present

## 2024-03-30 DIAGNOSIS — Z8719 Personal history of other diseases of the digestive system: Secondary | ICD-10-CM | POA: Diagnosis not present

## 2024-04-06 ENCOUNTER — Ambulatory Visit: Admitting: Podiatry
